# Patient Record
Sex: Male | Born: 1940 | Race: White | Hispanic: No | Smoking: Former smoker
Health system: Southern US, Community
[De-identification: ages and names within clinical notes are randomized; demographics above are authoritative.]

## PROBLEM LIST (undated history)

## (undated) DIAGNOSIS — J449 Chronic obstructive pulmonary disease, unspecified: Secondary | ICD-10-CM

## (undated) DIAGNOSIS — C439 Malignant melanoma of skin, unspecified: Secondary | ICD-10-CM

## (undated) DIAGNOSIS — J189 Pneumonia, unspecified organism: Secondary | ICD-10-CM

## (undated) DIAGNOSIS — I10 Essential (primary) hypertension: Secondary | ICD-10-CM

## (undated) HISTORY — DX: Essential (primary) hypertension: I10

## (undated) HISTORY — DX: Chronic obstructive pulmonary disease, unspecified: J44.9

## (undated) HISTORY — DX: Malignant melanoma of skin, unspecified: C43.9

## (undated) HISTORY — PX: HEMORRHOID SURGERY: SHX153

## (undated) HISTORY — DX: Pneumonia, unspecified organism: J18.9

---

## 1999-12-07 ENCOUNTER — Ambulatory Visit (HOSPITAL_COMMUNITY): Admission: RE | Admit: 1999-12-07 | Discharge: 1999-12-07 | Payer: Self-pay | Admitting: *Deleted

## 1999-12-25 ENCOUNTER — Ambulatory Visit (HOSPITAL_COMMUNITY): Admission: RE | Admit: 1999-12-25 | Discharge: 1999-12-25 | Payer: Self-pay | Admitting: *Deleted

## 2000-08-23 ENCOUNTER — Encounter: Payer: Self-pay | Admitting: Hematology and Oncology

## 2000-08-23 ENCOUNTER — Encounter: Admission: RE | Admit: 2000-08-23 | Discharge: 2000-08-23 | Payer: Self-pay | Admitting: Hematology and Oncology

## 2000-11-13 ENCOUNTER — Encounter: Payer: Self-pay | Admitting: Hematology and Oncology

## 2000-11-13 ENCOUNTER — Encounter: Admission: RE | Admit: 2000-11-13 | Discharge: 2000-11-13 | Payer: Self-pay | Admitting: Hematology and Oncology

## 2011-02-16 HISTORY — PX: OTHER SURGICAL HISTORY: SHX169

## 2011-03-13 ENCOUNTER — Ambulatory Visit (HOSPITAL_BASED_OUTPATIENT_CLINIC_OR_DEPARTMENT_OTHER)
Admission: RE | Admit: 2011-03-13 | Discharge: 2011-03-13 | Disposition: A | Discharge status: Home / Self Care | Payer: Medicare Other | Source: Ambulatory Visit | Attending: Pulmonary Disease | Admitting: Pulmonary Disease

## 2011-03-13 ENCOUNTER — Encounter: Payer: Self-pay | Admitting: Pulmonary Disease

## 2011-03-13 ENCOUNTER — Ambulatory Visit (INDEPENDENT_AMBULATORY_CARE_PROVIDER_SITE_OTHER): Payer: Medicare Other | Admitting: Pulmonary Disease

## 2011-03-13 VITALS — BP 130/60 | HR 120 | Temp 97.5°F | Ht 61.5 in | Wt 124.0 lb

## 2011-03-13 DIAGNOSIS — Z77090 Contact with and (suspected) exposure to asbestos: Secondary | ICD-10-CM

## 2011-03-13 DIAGNOSIS — J449 Chronic obstructive pulmonary disease, unspecified: Secondary | ICD-10-CM

## 2011-03-13 DIAGNOSIS — J4489 Other specified chronic obstructive pulmonary disease: Secondary | ICD-10-CM

## 2011-03-13 NOTE — Assessment & Plan Note (Signed)
Gold stg 4, FEV1 24% 6/12 Add spiriva to advair OK to use ventolin prn, If no improvement on spiriva (or too expensive) consider nebs at home. Chest xray today for h/o asbestos exposure Consider pulm rehab & O2 evaluation on future visits

## 2011-03-13 NOTE — Progress Notes (Signed)
  Subjective:    Patient ID: [name], male    DOB: [birth year], 70 y.o.   MRN: [id number]  HPI 70/M, heavy ex smoker , quit 1992 for evaluation of COPD & asbestos exposure. He report sincreasing dyspnea for the last few years & now can barely walk a few feet. He was diagnosed with COPD several years ago & is maintained on advair bid & ventolin prn - he uses this 5-6 times daily. He takes lisinopril for hypertension & verapamil. He denies frequent chest colds or recent hospitalisatins. Daughter reports some wt loss - unquantified, fatigue & sleepy all the time. Sprieomtry showed severe airway obstruction- FEv1 24%. 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 Negative for fever.  HENT: Positive for nosebleeds, congestion, rhinorrhea, sneezing and postnasal drip. Negative for ear pain, sore throat, trouble swallowing and dental problem.   Eyes: Negative for redness.  Respiratory: Positive for cough, shortness of breath and wheezing.   Cardiovascular: Negative for chest pain, palpitations and leg swelling.  Gastrointestinal: Positive for vomiting. Negative for nausea, abdominal pain and diarrhea.  Genitourinary: Negative for dysuria and urgency.  Musculoskeletal: Negative for joint swelling.  Skin: Negative for rash.  Neurological: Positive for light-headedness. Negative for syncope and headaches.  Hematological: Does not bruise/bleed easily.  Psychiatric/Behavioral: Negative for dysphoric mood. The patient is nervous/anxious.        Objective:   Physical Exam Gen. Pleasant, well-nourished, in no distress, normal affect, in wheelchair ENT - no lesions, no post nasal drip Neck: No JVD, no thyromegaly, no carotid bruits Lungs: no use of accessory muscles, no dullness to percussion, decreased  without rales or rhonchi  Cardiovascular: Rhythm regular, heart  sounds  normal, no murmurs or gallops, no peripheral edema Abdomen: soft and non-tender, no hepatosplenomegaly, BS normal. Musculoskeletal: No deformities, no cyanosis or clubbing Neuro:  alert, non focal        Assessment & Plan:

## 2011-03-13 NOTE — Patient Instructions (Addendum)
Your lung capacity is 26% Chest xray today Trial of spiriva- call us in 1 week  For Rx if this helps. If this does not help , we will consider nebuliser next visit

## 2011-03-15 ENCOUNTER — Telehealth: Payer: Self-pay | Admitting: Pulmonary Disease

## 2011-03-15 NOTE — Telephone Encounter (Signed)
I informed pt of RA's findings and recommendations. Pt verbalized understanding. See result note.

## 2011-03-15 NOTE — Progress Notes (Signed)
Quick Note:  I informed pt of RA's findings and recommendations. Pt verbalized understanding  ______

## 2011-03-21 ENCOUNTER — Telehealth: Payer: Self-pay | Admitting: Pulmonary Disease

## 2011-03-21 MED ORDER — TIOTROPIUM BROMIDE MONOHYDRATE 18 MCG IN CAPS: 18.0000 ug | ORAL_CAPSULE | Freq: Every day | RESPIRATORY_TRACT | Status: DC

## 2011-03-21 NOTE — Telephone Encounter (Signed)
Called and spoke with pt's daughter, Chip Boer.  Chip Boer states pt was recently seen by RA on 7/3 and given sample of Spiriva and told to call back for rx if it helped.  Chip Boer states pt has tried the Spiriva and his breathing has improved and therefore is requesting 90 day rx sent to pharmacy.  Rx sent to pharmacy. Pt aware.

## 2011-03-21 NOTE — Telephone Encounter (Signed)
ATC NA, no option to leave msg, [facility]

## 2011-03-21 NOTE — Telephone Encounter (Signed)
This is a duplicate message.  See other phone note dated 7/11 for complete details.

## 2011-04-02 ENCOUNTER — Inpatient Hospital Stay (HOSPITAL_COMMUNITY)
Admission: EM | Admit: 2011-04-02 | Discharge: 2011-04-17 | DRG: 329 | Disposition: A | Discharge status: Home Health | Payer: Medicare Other | Attending: Internal Medicine | Admitting: Internal Medicine

## 2011-04-02 ENCOUNTER — Emergency Department (HOSPITAL_COMMUNITY): Payer: Medicare Other

## 2011-04-02 ENCOUNTER — Inpatient Hospital Stay (HOSPITAL_COMMUNITY): Payer: Medicare Other

## 2011-04-02 DIAGNOSIS — K560 Paralytic ileus: Secondary | ICD-10-CM | POA: Diagnosis not present

## 2011-04-02 DIAGNOSIS — F19921 Other psychoactive substance use, unspecified with intoxication with delirium: Secondary | ICD-10-CM | POA: Diagnosis not present

## 2011-04-02 DIAGNOSIS — T4275XA Adverse effect of unspecified antiepileptic and sedative-hypnotic drugs, initial encounter: Secondary | ICD-10-CM | POA: Diagnosis not present

## 2011-04-02 DIAGNOSIS — R5381 Other malaise: Secondary | ICD-10-CM | POA: Diagnosis present

## 2011-04-02 DIAGNOSIS — E876 Hypokalemia: Secondary | ICD-10-CM | POA: Diagnosis not present

## 2011-04-02 DIAGNOSIS — J4489 Other specified chronic obstructive pulmonary disease: Secondary | ICD-10-CM | POA: Diagnosis present

## 2011-04-02 DIAGNOSIS — I498 Other specified cardiac arrhythmias: Secondary | ICD-10-CM | POA: Diagnosis present

## 2011-04-02 DIAGNOSIS — R339 Retention of urine, unspecified: Secondary | ICD-10-CM | POA: Diagnosis not present

## 2011-04-02 DIAGNOSIS — D630 Anemia in neoplastic disease: Secondary | ICD-10-CM | POA: Diagnosis present

## 2011-04-02 DIAGNOSIS — K2970 Gastritis, unspecified, without bleeding: Secondary | ICD-10-CM | POA: Diagnosis present

## 2011-04-02 DIAGNOSIS — K5730 Diverticulosis of large intestine without perforation or abscess without bleeding: Secondary | ICD-10-CM | POA: Diagnosis present

## 2011-04-02 DIAGNOSIS — J449 Chronic obstructive pulmonary disease, unspecified: Secondary | ICD-10-CM | POA: Diagnosis present

## 2011-04-02 DIAGNOSIS — F17200 Nicotine dependence, unspecified, uncomplicated: Secondary | ICD-10-CM | POA: Diagnosis present

## 2011-04-02 DIAGNOSIS — Z79899 Other long term (current) drug therapy: Secondary | ICD-10-CM

## 2011-04-02 DIAGNOSIS — Z85820 Personal history of malignant melanoma of skin: Secondary | ICD-10-CM

## 2011-04-02 DIAGNOSIS — E236 Other disorders of pituitary gland: Secondary | ICD-10-CM | POA: Diagnosis present

## 2011-04-02 DIAGNOSIS — C180 Malignant neoplasm of cecum: Principal | ICD-10-CM | POA: Diagnosis present

## 2011-04-02 DIAGNOSIS — J9620 Acute and chronic respiratory failure, unspecified whether with hypoxia or hypercapnia: Secondary | ICD-10-CM | POA: Diagnosis not present

## 2011-04-02 DIAGNOSIS — K2990 Gastroduodenitis, unspecified, without bleeding: Secondary | ICD-10-CM | POA: Diagnosis present

## 2011-04-02 DIAGNOSIS — I10 Essential (primary) hypertension: Secondary | ICD-10-CM | POA: Diagnosis present

## 2011-04-02 LAB — COMPREHENSIVE METABOLIC PANEL
AST: 17 U/L (ref 0–37)
CO2: 24 mEq/L (ref 19–32)
Chloride: 89 mEq/L — ABNORMAL LOW (ref 96–112)
Creatinine, Ser: 0.73 mg/dL (ref 0.50–1.35)
GFR calc Af Amer: >60 mL/min (ref >60)
GFR calc non Af Amer: >60 mL/min (ref >60)
Glucose, Bld: 120 mg/dL — ABNORMAL HIGH (ref 70–99)
Total Bilirubin: 0.3 mg/dL (ref 0.3–1.2)

## 2011-04-02 LAB — PREPARE RBC (CROSSMATCH)

## 2011-04-02 LAB — PROTIME-INR: INR: 1.04 (ref 0.00–1.49)

## 2011-04-02 LAB — CBC
MCH: 16.9 pg — ABNORMAL LOW (ref 26.0–34.0)
MCHC: 28.3 g/dL — ABNORMAL LOW (ref 30.0–36.0)
Platelets: 568 10*3/uL — ABNORMAL HIGH (ref 150–400)

## 2011-04-02 LAB — URINALYSIS, ROUTINE W REFLEX MICROSCOPIC
Bilirubin Urine: NEGATIVE
Hgb urine dipstick: NEGATIVE
Specific Gravity, Urine: 1.017 (ref 1.005–1.030)
Urobilinogen, UA: 0.2 mg/dL (ref 0.0–1.0)
pH: 6.5 (ref 5.0–8.0)

## 2011-04-02 LAB — IRON AND TIBC
Iron: 10 ug/dL — ABNORMAL LOW (ref 42–135)
Saturation Ratios: 2 % — ABNORMAL LOW (ref 20–55)
UIBC: 410 ug/dL

## 2011-04-02 LAB — DIFFERENTIAL
Basophils Absolute: 0 10*3/uL (ref 0.0–0.1)
Eosinophils Absolute: 0 10*3/uL (ref 0.0–0.7)
Lymphocytes Relative: 8 % — ABNORMAL LOW (ref 12–46)
Neutro Abs: 10 10*3/uL — ABNORMAL HIGH (ref 1.7–7.7)
Neutrophils Relative %: 85 % — ABNORMAL HIGH (ref 43–77)

## 2011-04-02 LAB — OCCULT BLOOD, POC DEVICE: Fecal Occult Bld: NEGATIVE

## 2011-04-03 ENCOUNTER — Inpatient Hospital Stay (HOSPITAL_COMMUNITY): Payer: Medicare Other

## 2011-04-03 DIAGNOSIS — M7989 Other specified soft tissue disorders: Secondary | ICD-10-CM

## 2011-04-03 LAB — DIFFERENTIAL
Band Neutrophils: 0 % (ref 0–10)
Basophils Absolute: 0 10*3/uL (ref 0.0–0.1)
Basophils Relative: 0 % (ref 0–1)
Blasts: 0 %
Eosinophils Absolute: 0 10*3/uL (ref 0.0–0.7)
Eosinophils Relative: 0 % (ref 0–5)
Lymphocytes Relative: 7 % — ABNORMAL LOW (ref 12–46)
Lymphs Abs: 0.7 10*3/uL (ref 0.7–4.0)
Metamyelocytes Relative: 0 %
Monocytes Absolute: 0.3 10*3/uL (ref 0.1–1.0)
Monocytes Relative: 3 % (ref 3–12)
Myelocytes: 0 %
Neutro Abs: 9 10*3/uL — ABNORMAL HIGH (ref 1.7–7.7)
Neutrophils Relative %: 90 % — ABNORMAL HIGH (ref 43–77)
Promyelocytes Absolute: 0 %
nRBC: 0 /100 WBC

## 2011-04-03 LAB — COMPREHENSIVE METABOLIC PANEL
ALT: 9 U/L (ref 0–53)
AST: 17 U/L (ref 0–37)
Albumin: 3.1 g/dL — ABNORMAL LOW (ref 3.5–5.2)
Alkaline Phosphatase: 51 U/L (ref 39–117)
BUN: 4 mg/dL — ABNORMAL LOW (ref 6–23)
CO2: 26 mEq/L (ref 19–32)
Calcium: 8.6 mg/dL (ref 8.4–10.5)
Chloride: 91 mEq/L — ABNORMAL LOW (ref 96–112)
Creatinine, Ser: 0.55 mg/dL (ref 0.50–1.35)
GFR calc Af Amer: >60 mL/min (ref >60)
GFR calc non Af Amer: >60 mL/min (ref >60)
Glucose, Bld: 109 mg/dL — ABNORMAL HIGH (ref 70–99)
Potassium: 3.7 mEq/L (ref 3.5–5.1)
Sodium: 123 mEq/L — ABNORMAL LOW (ref 135–145)
Total Bilirubin: 0.8 mg/dL (ref 0.3–1.2)
Total Protein: 6 g/dL (ref 6.0–8.3)

## 2011-04-03 LAB — CBC
HCT: 24.1 % — ABNORMAL LOW (ref 39.0–52.0)
Hemoglobin: 7.4 g/dL — ABNORMAL LOW (ref 13.0–17.0)
MCH: 20.3 pg — ABNORMAL LOW (ref 26.0–34.0)
MCHC: 30.7 g/dL (ref 30.0–36.0)
MCV: 66 fL — ABNORMAL LOW (ref 78.0–100.0)
Platelets: 394 10*3/uL (ref 150–400)
RBC: 3.65 MIL/uL — ABNORMAL LOW (ref 4.22–5.81)
RDW: 25.1 % — ABNORMAL HIGH (ref 11.5–15.5)
WBC: 10 10*3/uL (ref 4.0–10.5)

## 2011-04-03 LAB — MAGNESIUM: Magnesium: 1.3 mg/dL — ABNORMAL LOW (ref 1.5–2.5)

## 2011-04-03 LAB — PHOSPHORUS: Phosphorus: 3.5 mg/dL (ref 2.3–4.6)

## 2011-04-03 LAB — PREPARE RBC (CROSSMATCH)

## 2011-04-03 MED ORDER — IOHEXOL 300 MG/ML  SOLN
100.0000 mL | Freq: Once | INTRAMUSCULAR | Status: AC | PRN
  Administered 2011-04-03: 100 mL via INTRAVENOUS

## 2011-04-03 NOTE — H&P (Signed)
[name], [name]                 ACCOUNT NO.:  [id number]  MEDICAL RECORD NO.:  [id number]  LOCATION:  WLED                         FACILITY:  [facility]  PHYSICIAN:  Michiel Cowboy, MDDATE OF BIRTH:  August 29, 1941  DATE OF ADMISSION:  04/02/2011 DATE OF DISCHARGE:                             HISTORY & PHYSICAL   PRIMARY CARE PHYSICIAN:  Candie Echevaria, DO, [facility].  CHIEF COMPLAINT:  He was sent here by his primary care provider for hemoglobin.  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  The patient was told for the past 2 years that he has been anemic.  He never required blood transfusions in the past.  He was told that he does have some blood in his stools, although he does not have it today.  He never noticed any melena or bright red blood per rectum.  He does note about 20-pound weight loss over the past 6 months.  He never had a colonoscopy done.  He does have some shortness of breath which is his baseline because of COPD.  He does not use any oxygen.  He also noticed that his left leg has been swollen for the past maybe 1 month, may be less.  REVIEW OF SYSTEMS:  Otherwise review of systems is negative, 10 systems reviewed.  No fevers no chills.  No worsening cough.  No ataxia.  No neurological complaints.  No nausea or vomiting.  No constipation or diarrhea.  No chest pain also.  PAST MEDICAL HISTORY:  Significant for: 1. COPD, followed by Dr. Vassie Loll. 2. Anemia, chronic, unclear etiology. 3. History of Hemoccult positive stools, not fully worked up. 4. Hypertension. 5. History of melanoma, removed in 2001.  SOCIAL HISTORY:  The patient is a former smoker, quit 20 years ago but since then has been chewing tobacco.  He drinks on occasion, does not abuse drugs.  FAMILY HISTORY:  The patient is  adopted.  ALLERGIES:  He is not quite sure but one of the BLOOD PRESSURE MEDICINES he once received seemed to be too strong.  MEDICATIONS: 1. Spiriva inhaled once a day. 2. Multivitamins. 3. Advair 250/50, 1 puff in the evening. 4. Albuterol inhaler as needed. 5. Verapamil 120 mg daily. 6. Lisinopril 20 mg daily. 7. Hydrochlorothiazide 25 mg daily.  PHYSICAL EXAMINATION:  VITAL SIGNS:  Temperature 97.8, blood pressure 173/86, pulse initially was 142 now down to 113.  Of note, per his records from his pulmonologist's office, his heart rate was in 120s. Respirations 24, saturating 94% on room air and 100% on 2 L. GENERAL:  The patient appears to be in no acute distress, thin male. HEENT AND NECK:  Head nontraumatic.  There are heavy crevices on angles of the mouth.  No lymphadenopathy could be appreciated.  Moist mucous membranes. LUNGS:  Very distant breath sounds but no wheezes. HEART:    No murmurs appreciated, but also very distant. ABDOMEN:  Soft, nontender, nondistended. EXTREMITIES:  Trace edema on the left lower extremity, which is not same as in the right.  NEUROLOGIC:  Grossly intact. SKIN:  Slight sun exposure but otherwise normal skin turgor.  I could appreciate no significant lesions.  PERTINENT LABORATORY AND X-RAY DATA:  White blood cell count 11.7, hemoglobin 5.8.  Sodium 124, potassium 3.8, creatinine 0.73, blood sugar 120.  Total protein 7.0, albumin 3.7.  LFTs within normal limits. Hemoccult negative.  Chest x-ray shows interval increase in right-sided middle lobe disease, scarring versus atelectasis versus infiltrate.  Of note, the MCV count is 59.6.  EKG was obtained and shows sinus tachycardia, heart rate of 118.  No significant ST depressions or elevations.  No ischemia sign.  ASSESSMENT AND PLAN:  This is a 70 year old gentleman with anemia of unclear etiology.  In the past, he has had Hemoccult positive stools, although they are negative currently.   Per emergency physician, they have already obtained anemia panel.  Will admit for further evaluation. Will transfuse 2 units of packed red blood cells as the patient is symptomatic; he is tachycardic and somewhat short of breath.  Will continue to do Hemoccult stools; it could be a very slow bleed.  He never had a colonoscopy.  Given his weight loss, I think he would greatly benefit from one; this could be done as an outpatient if no significant bleeding occurs.  He probably would benefit from cancer workup altogether given his weight loss and anemia.  Hyponatremia:  Will obtain urine sodium, creatinine and urine osmolarity.  Check orthostatics.  Stop hydrochlorothiazide.  Check TSH.  Abnormal chest x-ray findings:  This has been persistent since the beginning of July.  He has not had any symptoms which would be consistent with pneumonia.  At this point, we will obtain CT scan of the chest to evaluate this further.  Given his smoking history and significant COPD, weight loss, I would evaluate for a mass.  Hypertension:  For right now, continue lisinopril and verapamil.  Weight loss:  Will have to have oncological workup done.  This could be completed as an outpatient as well.  Chronic obstructive pulmonary disease:  At this point appears to be stable.  We will continue his home medications.  He is significantly anemic, we can keep him on oxygen, but now titrate to keep O2 saturations between 90% to 94%.  Prophylaxis:  Protonix, and __________.  Left leg swelling:  We will obtain Dopplers.  CODE STATUS:  At this point, the patient wishes to be full code, which was confirmed by his daughter who is at bedside.     Michiel Cowboy, MD     AVD/MEDQ  D:  04/02/2011  T:  04/02/2011  Job:  [id number]  cc:   Candie Echevaria, DO Fax: [phone]  Electronically Signed by Therisa Doyne MD on 04/03/2011 10:18:52 PM

## 2011-04-04 LAB — TYPE AND SCREEN
ABO/RH(D): O POS
Antibody Screen: NEGATIVE
Unit division: 0

## 2011-04-04 LAB — BASIC METABOLIC PANEL
BUN: 3 mg/dL — ABNORMAL LOW (ref 6–23)
CO2: 23 mEq/L (ref 19–32)
Calcium: 8.4 mg/dL (ref 8.4–10.5)
Creatinine, Ser: 0.49 mg/dL — ABNORMAL LOW (ref 0.50–1.35)
Glucose, Bld: 84 mg/dL (ref 70–99)

## 2011-04-04 LAB — CBC
HCT: 28.4 % — ABNORMAL LOW (ref 39.0–52.0)
MCH: 22.3 pg — ABNORMAL LOW (ref 26.0–34.0)
MCV: 69.6 fL — ABNORMAL LOW (ref 78.0–100.0)
Platelets: 325 10*3/uL (ref 150–400)
RBC: 4.08 MIL/uL — ABNORMAL LOW (ref 4.22–5.81)
WBC: 6.5 10*3/uL (ref 4.0–10.5)

## 2011-04-04 LAB — SODIUM, URINE, RANDOM: Sodium, Ur: 33 mEq/L

## 2011-04-04 LAB — OSMOLALITY, URINE: Osmolality, Ur: 148 mOsm/kg — ABNORMAL LOW (ref 390–1090)

## 2011-04-04 LAB — CORTISOL: Cortisol, Plasma: 16.6 ug/dL

## 2011-04-04 NOTE — Consult Note (Signed)
  NAMERAHSAAN, WEAKLAND NO.:  [id number]  MEDICAL RECORD NO.:  [id number]  LOCATION:  1507                         FACILITY:  [facility]  PHYSICIAN:  Graylin Shiver, M.D.   DATE OF BIRTH:  November 02, 1940  DATE OF CONSULTATION:  04/04/2011 DATE OF DISCHARGE:                                CONSULTATION   REASON FOR CONSULTATION:  The patient is a 70 year old male with a history of COPD and a prior history of melanoma on his back that was removed in 2001.  He was told that he was anemic a couple of years ago and also was found to have heme-positive stool a couple of years ago. He was told that he should have colonoscopy but never did.  He has lost about 20 pounds over the past 6 months.  He has not seen any blood in his stool and denies melena.  He was found on admission to have severe anemia with a hemoglobin of 5.8 and hematocrit of 20.5.  Iron studies were compatible with iron deficiency.  B12 level and folate levels were normal.  His stools for occult blood during this admission have been negative.  We are asked to see him in regards to iron deficiency anemia and setting him up for colonoscopy to evaluate.  PAST HISTORY:  COPD, hypertension, history of melanoma.  SOCIAL HISTORY:  States he used to drink and used to smoke.  ALLERGIES:  Did not know names of any medicines he was allergic to.  MEDICATIONS PRIOR TO ADMISSION:  Spiriva, multivitamin, Advair, albuterol, verapamil, lisinopril, hydrochlorothiazide.  REVIEW OF SYSTEMS:  No complaints of chest pain but he does have problems with shortness of breath due to his COPD.  PHYSICAL EXAMINATION:  GENERAL:  He does not appear in any acute distress. VITAL SIGNS:  Stable. HEART:  Regular rhythm.  No murmurs heard. LUNGS:  Decreased breath sounds bilaterally.  He does seem to have a barrel chest. ABDOMEN:  Bowel sounds are normal.  Soft, nontender.  No hepatosplenomegaly.  IMPRESSION:  Iron-deficiency anemia,  etiology unclear.  COMMENTS:  In view of the past history of heme-positive stool and iron- deficiency anemia and also weight loss, I would certainly recommend a colonoscopy.  I discussed this with the patient but he wants to think about it and discuss this first with his daughter and his brother before agreeing to have it done.  He states that his brother had a colonoscopy in the past and told him that he would never have another one.  It appears that the patient is somewhat reluctant at this time to have a colonoscopy but we will think about it. I will get back in touch with him to see what he has decided.  He is aware that he could have an underlying malignancy in the colon or polyp in the colon but he still wants to think about it.          ______________________________ Graylin Shiver, M.D.     SFG/MEDQ  D:  04/04/2011  T:  04/04/2011  Job:  [id number]  Electronically Signed by Herbert Moors MD on 04/04/2011 03:44:50 PM

## 2011-04-05 ENCOUNTER — Other Ambulatory Visit: Payer: Self-pay | Admitting: Gastroenterology

## 2011-04-05 LAB — CBC
HCT: 30.9 % — ABNORMAL LOW (ref 39.0–52.0)
Hemoglobin: 9.7 g/dL — ABNORMAL LOW (ref 13.0–17.0)
MCH: 22.2 pg — ABNORMAL LOW (ref 26.0–34.0)
MCHC: 31.4 g/dL (ref 30.0–36.0)
MCV: 70.7 fL — ABNORMAL LOW (ref 78.0–100.0)
RBC: 4.37 MIL/uL (ref 4.22–5.81)

## 2011-04-05 LAB — BASIC METABOLIC PANEL
BUN: <3 mg/dL — ABNORMAL LOW (ref 6–23)
CO2: 26 mEq/L (ref 19–32)
Calcium: 9 mg/dL (ref 8.4–10.5)
Creatinine, Ser: 0.49 mg/dL — ABNORMAL LOW (ref 0.50–1.35)
GFR calc non Af Amer: >60 mL/min (ref >60)
Glucose, Bld: 80 mg/dL (ref 70–99)

## 2011-04-06 ENCOUNTER — Inpatient Hospital Stay (HOSPITAL_COMMUNITY): Payer: Medicare Other

## 2011-04-06 DIAGNOSIS — J449 Chronic obstructive pulmonary disease, unspecified: Secondary | ICD-10-CM

## 2011-04-06 DIAGNOSIS — D490 Neoplasm of unspecified behavior of digestive system: Secondary | ICD-10-CM

## 2011-04-06 LAB — CBC
MCH: 22.3 pg — ABNORMAL LOW (ref 26.0–34.0)
MCHC: 31.5 g/dL (ref 30.0–36.0)
MCV: 70.8 fL — ABNORMAL LOW (ref 78.0–100.0)
Platelets: 334 10*3/uL (ref 150–400)

## 2011-04-06 LAB — BASIC METABOLIC PANEL
CO2: 25 mEq/L (ref 19–32)
Calcium: 8.6 mg/dL (ref 8.4–10.5)
Creatinine, Ser: 0.53 mg/dL (ref 0.50–1.35)
GFR calc non Af Amer: >60 mL/min (ref >60)
Sodium: 128 mEq/L — ABNORMAL LOW (ref 135–145)

## 2011-04-06 LAB — CEA: CEA: 1.2 ng/mL (ref 0.0–5.0)

## 2011-04-06 MED ORDER — IOHEXOL 300 MG/ML  SOLN
100.0000 mL | Freq: Once | INTRAMUSCULAR | Status: AC | PRN
  Administered 2011-04-06: 100 mL via INTRAVENOUS

## 2011-04-07 LAB — SURGICAL PCR SCREEN: MRSA, PCR: NEGATIVE

## 2011-04-08 ENCOUNTER — Inpatient Hospital Stay (HOSPITAL_COMMUNITY): Payer: Medicare Other

## 2011-04-08 LAB — BASIC METABOLIC PANEL
CO2: 27 mEq/L (ref 19–32)
Chloride: 97 mEq/L (ref 96–112)
Glucose, Bld: 96 mg/dL (ref 70–99)
Potassium: 3.5 mEq/L (ref 3.5–5.1)
Sodium: 130 mEq/L — ABNORMAL LOW (ref 135–145)

## 2011-04-08 LAB — CBC
Hemoglobin: 9 g/dL — ABNORMAL LOW (ref 13.0–17.0)
Platelets: 301 10*3/uL (ref 150–400)
RBC: 4 MIL/uL — ABNORMAL LOW (ref 4.22–5.81)
WBC: 5.9 10*3/uL (ref 4.0–10.5)

## 2011-04-09 ENCOUNTER — Other Ambulatory Visit (INDEPENDENT_AMBULATORY_CARE_PROVIDER_SITE_OTHER): Payer: Self-pay | Admitting: General Surgery

## 2011-04-09 DIAGNOSIS — C189 Malignant neoplasm of colon, unspecified: Secondary | ICD-10-CM

## 2011-04-09 LAB — BASIC METABOLIC PANEL
BUN: <3 mg/dL — ABNORMAL LOW (ref 6–23)
CO2: 29 mEq/L (ref 19–32)
Chloride: 93 mEq/L — ABNORMAL LOW (ref 96–112)
GFR calc non Af Amer: >60 mL/min (ref >60)
Glucose, Bld: 100 mg/dL — ABNORMAL HIGH (ref 70–99)
Potassium: 3.9 mEq/L (ref 3.5–5.1)
Sodium: 127 mEq/L — ABNORMAL LOW (ref 135–145)

## 2011-04-10 ENCOUNTER — Inpatient Hospital Stay (HOSPITAL_COMMUNITY): Payer: Medicare Other

## 2011-04-10 DIAGNOSIS — J9620 Acute and chronic respiratory failure, unspecified whether with hypoxia or hypercapnia: Secondary | ICD-10-CM

## 2011-04-10 DIAGNOSIS — J159 Unspecified bacterial pneumonia: Secondary | ICD-10-CM

## 2011-04-10 DIAGNOSIS — C189 Malignant neoplasm of colon, unspecified: Secondary | ICD-10-CM

## 2011-04-10 DIAGNOSIS — J438 Other emphysema: Secondary | ICD-10-CM

## 2011-04-10 LAB — CBC
HCT: 34.2 % — ABNORMAL LOW (ref 39.0–52.0)
MCHC: 31 g/dL (ref 30.0–36.0)
Platelets: 281 10*3/uL (ref 150–400)
RDW: UNDETERMINED % (ref 11.5–15.5)

## 2011-04-10 LAB — BLOOD GAS, ARTERIAL
Bicarbonate: 28.8 mEq/L — ABNORMAL HIGH (ref 20.0–24.0)
Delivery systems: POSITIVE
Expiratory PAP: 5
FIO2: 0.4 %
O2 Saturation: 93.6 %
Patient temperature: 99
Patient temperature: 98.6
TCO2: 26.5 mmol/L (ref 0–100)
TCO2: 27.6 mmol/L (ref 0–100)
pCO2 arterial: 50.1 mmHg — ABNORMAL HIGH (ref 35.0–45.0)
pH, Arterial: 7.397 (ref 7.350–7.450)

## 2011-04-10 LAB — CROSSMATCH
ABO/RH(D): O POS
Antibody Screen: NEGATIVE
Unit division: 0

## 2011-04-10 LAB — COMPREHENSIVE METABOLIC PANEL
Albumin: 2.6 g/dL — ABNORMAL LOW (ref 3.5–5.2)
Alkaline Phosphatase: 53 U/L (ref 39–117)
BUN: 4 mg/dL — ABNORMAL LOW (ref 6–23)
CO2: 29 mEq/L (ref 19–32)
Chloride: 92 mEq/L — ABNORMAL LOW (ref 96–112)
GFR calc non Af Amer: >60 mL/min (ref >60)
Glucose, Bld: 105 mg/dL — ABNORMAL HIGH (ref 70–99)
Potassium: 4.1 mEq/L (ref 3.5–5.1)
Total Bilirubin: 0.7 mg/dL (ref 0.3–1.2)

## 2011-04-10 LAB — BASIC METABOLIC PANEL
BUN: 4 mg/dL — ABNORMAL LOW (ref 6–23)
CO2: 31 mEq/L (ref 19–32)
Calcium: 8.7 mg/dL (ref 8.4–10.5)
GFR calc non Af Amer: >60 mL/min (ref >60)
Glucose, Bld: 84 mg/dL (ref 70–99)
Potassium: 4.2 mEq/L (ref 3.5–5.1)

## 2011-04-10 LAB — MAGNESIUM: Magnesium: 1.1 mg/dL — ABNORMAL LOW (ref 1.5–2.5)

## 2011-04-10 MED ORDER — LORAZEPAM 2 MG/ML IJ SOLN
INTRAMUSCULAR | Status: AC
  Filled 2011-04-10: qty 1

## 2011-04-10 NOTE — Op Note (Signed)
NAMEABIE, CHEEK                 ACCOUNT NO.:  [id number]  MEDICAL RECORD NO.:  [id number]  LOCATION:  1507                         FACILITY:  [facility]  PHYSICIAN:  Anselm Pancoast. Ezrah Panning, M.D.DATE OF BIRTH:  05/15/1941  DATE OF PROCEDURE:  04/09/2011 DATE OF DISCHARGE:                              OPERATIVE REPORT   PREOPERATIVE DIAGNOSES: 1. Carcinoma of the cecum, ascending colon. 2. History of severe chronic obstructive pulmonary disease secondary     to smoking.  OPERATION:  Right colectomy through a limited right transverse incision with ileotransverse colon anastomosis.  SURGEON:  Anselm Pancoast. Zachery Dakins, MD  ASSISTANT:  Eber Hong, PA  ANESTHESIA:  General anesthesia.  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  He was admitted extremely anemic, transfused, had a CT that showed a apple core lesion in the cecum and ascending colon area and he had a colonoscopy by Dr. Evette Cristal that is biopsy-proven adenocarcinoma.  The patient was seen by the pulmonary people and also the general surgeons and Dr. Delford Field and associates had recommended that we give him several days of hospitalization for management of his COPD and surgery on Monday or possibly Tuesday of this week.  The patient has been transfused 4 units of blood.  His hematocrit is approximately 29 and we continued on his clear liquid diet after the colonoscopy with no additional bowel prep.  I saw him this morning and he desired to go ahead and proceed with surgery and Dr. Delford Field yesterday afternoon said it was okay to proceed even today or tomorrow and he was added to the OR schedule.  I gave him 1 g of Mefoxin preoperatively.  He has got PAS stockings and taken to the operative suite.  We permitted him for right colectomy.  I  reviewed his CT and colonoscopy reports with him and he understands and we are planning to do a limited right colectomy.  There was no evidence of any metastatic disease in the liver.  You could see a little what is probably cholesterolosis in the gallbladder, but no evidence of any stones.  I hope that we can do it through a small right transverse incision, he is fairly thin and patient was in agreement.  We will send him to step-down postoperatively because of his breathing issues.  DESCRIPTION OF PROCEDURE:  The patient was taken to the operative suite, given 1 g of Mefoxin, taken in 1 bag.  The anesthesiologist assisted in placement of the endotracheal tube and then the abdomen, after a Foley catheter had been inserted sterilely, was prepped with Betadine solution and draped in a sterile manner.  I elected to make a little transverse incision to the right of the umbilicus and then sharp dissection down through the skin and subcutaneous tissue.  The anterior fascia was opened and then the underlying rectus muscle was elevated over a Kelly and then divided with cautery.  We picked up the posterior peritoneum and rectus fascia, then the slow anesthetic muscle  relaxant was coming forth and there was a little bit of straining.  The anesthesiologist then basically paralyzed him.  The incision was opened a little further, probably about a 2.5 inch all total, so I could get a medium-sized Senaida Ores and you could see the tumor and feel the tumor right under the lateral below the incision.  The appendix was normal and there was a little bit of fluid around it, but it appears to be clear, nothing that I could see any peritoneal implants  or anything.  The lateral peritoneum was opened up carefully with cautery and Metzenbaum, so I could mobilize the cecum.  The appendix was pulled up into the incision and the appendiceal mesentery was clamped with a Kelly and the pedicle tied with 2-0 silk  sutures.  Now, the lateral peritoneal flexion was freed up and then the hepatic flexure area, everything was divided between [facility] and these were tied with either 2-0 or 3-0 silk and then you could mobilize the hepatic flexure nicely.  I could feel the gallbladder and I did, and did not feel any stones and there was no evidence of any inflammation of the gallbladder.  We then continued to kind of free up the posterior portion of the hepatic flexure, did not mobilize the duodenum, but could visualize it and then with the right colon up at skin level, I then picked an area about 6 inches from the ileocecal valve to start dividing the mesentery between [facility] and these were all pedicles tied with 2-0 silk.  The area of the transverse colon just to the right of the midline was also picked for the anastomosis and then the mesentery between this was divided between [facility] and doubly ligated the superior.  The right colic artery, doubly tied this with 2-0 silk, singly distal to this.  The venous and lymphatic was also tied with 2-0 silk.  With the mesentery completely divided, I then brought the terminal ileum and the transverse colon and kind of freed up the tenia and then opened the little area.  I put towels medial and lateral and then the little openings I used a GIA 55 on the antimesenteric of the ileum and the anterior tenia of the transverse colon.  This was fired and then I grabbed the suture line on both sides with an Allis and then used a TA-60 with the narrow staples and triangulated the little area after clamping and firing it more than 15 seconds or so, then divided the colon and terminal ileum and sent the specimen on the back table.  I did put a little 3-0 silk sutures on the little part inferiorly not truly mesentery, but no active bleeding and when I opened up the cecum, you could see where there was a lot of old black, I think, old blood in the cecum, so I informed his  children that he will have another bloody bowel movement or 2 postoperatively.  The mesenteric defect was divided and was closed with interrupted sutures of 3-0 silk and the anastomosis is lying without tension and you could manipulate this and has got a good 2-finger lumen and there is no evidence of any tension.  The little omentum, he does not have a generous omentum, was placed over the anastomosis and then the abdominal wall was closed using the running 0 PDS on the posterior rectus fascia and peritoneum.  A looped 0 PDS on the anterior fascia and did put about 20 cc of 0.5% Marcaine with  adrenalin in the abdominal wall laterally for kind of a limited field block.  The patient will try to get by without using the NG tube.  The anterior fascia which was coarse thicker was closed with running looped 0 PDS and then the skin was closed with staples.  The patient tolerated procedure nicely.  The estimated blood loss was minimal and grossly there was no any obviously enlarged lymph nodes and when I palpated the liver and inspected it, I find no evidence of anything that looks like metastatic disease and of course the CT had shown no evidence of any enlarged lymph nodes or any metastasis in the liver either.  The patient was sent to recovery room and we tried to use low-dose PCA morphine down, he had been given Entereg preoperatively.     Anselm Pancoast. Zachery Dakins, M.D.     WJW/MEDQ  D:  04/09/2011  T:  04/09/2011  Job:  [id number]  cc:   Graylin Shiver, M.D. Fax: [phone]  Dr. Delford Field  Hospitalist Service  Electronically Signed by Consuello Bossier M.D. on 04/10/2011 02:53:32 PM

## 2011-04-10 NOTE — Consult Note (Signed)
[name], [name]                 ACCOUNT NO.:  [id number]  MEDICAL RECORD NO.:  [id number]  LOCATION:  1507                         FACILITY:  [facility]  PHYSICIAN:  Ollen Gross. Vernell Morgans, M.D. DATE OF BIRTH:  June 05, 1941  DATE OF CONSULTATION:  04/06/2011 DATE OF DISCHARGE:                                CONSULTATION   TIME:  09:06 a.m.  REQUESTING PHYSICIAN:  Graylin Shiver, M.D.  CONSULTING SURGEON:  Ollen Gross. Vernell Morgans, M.D.  PRIMARY CARE PHYSICIAN:  Candie Echevaria, DO at [facility].  PULMONOLOGIST:  Oretha Milch, MD.  REASON FOR CONSULTATION:  Cecal mass.  HISTORY OF PRESENT ILLNESS:  [name] is a 70 year old white male with a history of COPD, hypertension and melanoma who noticed some blood in his stool over a year ago.  At that time, he had Hemoccult cards done which were positive.  Apparently, he was encouraged to get a colonoscopy but he did not follow through with this.  Over the last year or so, the patient has no longer noticed any melena or bright red blood in his stools.  However, approximately 4 days ago, the patient presented to the emergency department due to complaints of weakness and shortness of breath.  At that time, he was found to have a hemoglobin of 5.8.  A CT scan of the chest was obtained which revealed no pulmonary embolus but did show some pneumonia.  At this time, he was admitted and he was transfused and treated for his pneumonia.  He did have Hemoccult test that were done this admission which were negative but secondary to his severe anemia and a history of heme-positive stools, Gastroenterology was consulted.  The patient was taken to the endoscopy suite yesterday where he had a colonoscopy at which time he was found to have a 2.5 to 3 cm cecal mass.  Because of this finding, we have been asked to evaluate the patient for possible surgical intervention.  REVIEW OF SYSTEMS:  Please see HPI.  Otherwise, the patient admits to significant dyspnea on  exertion.  He denies any chest pain or urinary issues.  Otherwise, all other systems have been reviewed and are negative.  FAMILY HISTORY:  He does not know as he is adopted.  PAST MEDICAL HISTORY: 1. COPD. 2. Hypertension. 3. History of melanoma. 4. History of heme-positive stools with chronic anemia.  PAST SURGICAL HISTORY: 1. Removal of melanoma from right shoulder. 2. Hemorrhoidectomy.  SOCIAL HISTORY:  The patient is retired.  He lives by himself.  He quit smoking approximately 20 years ago but prior to that he smoked for 35 years approximately 2 packs a day.  He very rarely drinks any alcohol. Denies any illicit drug abuse.  ALLERGIES:  A blood pressure pill that he does not know the name of.  MEDICATIONS:  At home include: 1. Extra Strength Tylenol as needed. 2. Multivitamin. 3. Verapamil 120 mg every evening. 4. Prinivil 20 mg daily. 5. Hydrochlorothiazide 25 mg daily. 6. Spiriva 18 mcg every morning. 7. Advair Diskus 250/50 one puff every evening. 8. Albuterol inhaler 2 puffs every 6 hours p.r.n. shortness of breath.PHYSICAL EXAMINATION:  GENERAL:  [name] is  a pleasant 70 year old white male who is currently lying in bed, in no acute distress but does appear to be barrel-chested and short of breath. VITAL SIGNS:  Temperature 98.1, blood pressure 112/71, respirations 18, pulse 73. HEENT:  Head is normocephalic, atraumatic.  Sclerae noninjected.  Pupils are equal, round and reactive to light.  Ears and nose without any obvious masses or lesions.  However, he does have 2 L of nasal cannula currently.  Otherwise mouth is pink.  Throat shows no exudate. HEART:  Regular rate and rhythm.  Normal S1, S2.  No murmurs, gallops or rubs are noted. LUNGS:  Clear to auscultation bilaterally with no wheezes, rhonchi or rales noted.  However, the patient does have decreased breath sounds fairly significantly throughout.  Respiratory effort is nonlabored with oxygen  present. ABDOMEN:  Soft, nontender, nondistended with active bowel sounds.  No masses, hernias or organomegaly are noted.  MUSCULOSKELETAL:  All 4 extremities are symmetrical with no cyanosis or edema.  He does have bilateral upper extremity clubbing. SKIN:  Warm and dry with no masses, lesions or rashes. PSYCH:  The patient is alert and oriented x3 with an appropriate affect.  LABS AND DIAGNOSTICS:  Sodium 128, potassium 3.1, glucose 83, BUN less than 3, creatinine 0.53.  White blood cell count 6600, hemoglobin 8.7, hematocrit 27.6, platelet count is 334,000.  DIAGNOSTICS:  CT scan angiography of the chest reveals no pulmonary embolus or thoracic aortic dissection.  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  He did also have a colonoscopy which revealed a 2.5 to 3 cm cecal mass.  IMPRESSION: 1. Cecal mass, likely malignant, biopsies are pending. 2. Chronic anemia likely secondary to #1. 3. Chronic obstructive pulmonary disease. 4. Hypertension.  PLAN:  We will obtain a staging CT scan of the abdomen and pelvis to evaluate for metastatic disease.  I think given the size of the tumor it is very unlikely that there is any metastatic disease.  We will also obtain a CEA level in the meantime.  Given the patient's poor pulmonary status, we will have the pulmonologist see him and clear him preoperatively.  Given his decreased lung function, it is likely the patient may be on a ventilator postoperatively.  We have discussed this with the patient and daughter and they understand that this is a possibility.  We will plan for surgical intervention either sometime this weekend if we are able or early next week.  In the meantime, we will maintain him on a clear liquid diet, we would recommend do not advance him past this so that we do not have to repress him.  Currently his electrolytes are little low with a low  sodium and low potassium.  We would recommend repleting these prior to surgical intervention.  We will also obtain a type and cross in case it is needed during surgical intervention to transfuse him as he is anemic.  Otherwise, we will follow along with you.     Letha Cape, PA   ______________________________ Ollen Gross. Vernell Morgans, M.D.    KEO/MEDQ  D:  04/06/2011  T:  04/06/2011  Job:  [id number]  cc:   Oretha Milch, MD [address] [place] [address]  Candie Echevaria, DO Fax: [phone]  Graylin Shiver, M.D. Fax: [phone]  Electronically Signed by Barnetta Chapel PA on 04/09/2011 11:06:05 AM Electronically Signed by Chevis Pretty III M.D. on 04/10/2011 03:52:23 AM

## 2011-04-11 ENCOUNTER — Inpatient Hospital Stay (HOSPITAL_COMMUNITY): Payer: Medicare Other

## 2011-04-11 LAB — LACTIC ACID, PLASMA: Lactic Acid, Venous: 1.5 mmol/L (ref 0.5–2.2)

## 2011-04-11 LAB — CBC
HCT: 35.2 % — ABNORMAL LOW (ref 39.0–52.0)
MCHC: 29.8 g/dL — ABNORMAL LOW (ref 30.0–36.0)
Platelets: 250 10*3/uL (ref 150–400)
WBC: 23.4 10*3/uL — ABNORMAL HIGH (ref 4.0–10.5)

## 2011-04-11 LAB — BASIC METABOLIC PANEL
BUN: 10 mg/dL (ref 6–23)
Chloride: 87 mEq/L — ABNORMAL LOW (ref 96–112)
GFR calc Af Amer: >60 mL/min (ref >60)
GFR calc non Af Amer: >60 mL/min (ref >60)
Potassium: 4.1 mEq/L (ref 3.5–5.1)
Sodium: 129 mEq/L — ABNORMAL LOW (ref 135–145)

## 2011-04-12 ENCOUNTER — Inpatient Hospital Stay (HOSPITAL_COMMUNITY): Payer: Medicare Other

## 2011-04-12 DIAGNOSIS — J449 Chronic obstructive pulmonary disease, unspecified: Secondary | ICD-10-CM

## 2011-04-12 DIAGNOSIS — J4489 Other specified chronic obstructive pulmonary disease: Secondary | ICD-10-CM

## 2011-04-12 LAB — DIFFERENTIAL
Basophils Absolute: 0 10*3/uL (ref 0.0–0.1)
Basophils Relative: 0 % (ref 0–1)
Eosinophils Absolute: 0.1 10*3/uL (ref 0.0–0.7)
Lymphocytes Relative: 6 % — ABNORMAL LOW (ref 12–46)
Monocytes Absolute: 0.8 10*3/uL (ref 0.1–1.0)
Neutrophils Relative %: 87 % — ABNORMAL HIGH (ref 43–77)

## 2011-04-12 LAB — LEGIONELLA ANTIGEN, URINE

## 2011-04-12 LAB — STREP PNEUMONIAE URINARY ANTIGEN: Strep Pneumo Urinary Antigen: NEGATIVE

## 2011-04-12 LAB — PROCALCITONIN: Procalcitonin: <0.1 ng/mL

## 2011-04-12 LAB — BASIC METABOLIC PANEL
CO2: 34 mEq/L — ABNORMAL HIGH (ref 19–32)
Chloride: 89 mEq/L — ABNORMAL LOW (ref 96–112)
Creatinine, Ser: 0.8 mg/dL (ref 0.50–1.35)
GFR calc Af Amer: >60 mL/min (ref >60)
Sodium: 128 mEq/L — ABNORMAL LOW (ref 135–145)

## 2011-04-12 LAB — CBC
MCV: 72.3 fL — ABNORMAL LOW (ref 78.0–100.0)
Platelets: 226 10*3/uL (ref 150–400)
RBC: 4.23 MIL/uL (ref 4.22–5.81)
WBC: 13.9 10*3/uL — ABNORMAL HIGH (ref 4.0–10.5)

## 2011-04-13 ENCOUNTER — Inpatient Hospital Stay (HOSPITAL_COMMUNITY): Payer: Medicare Other

## 2011-04-13 DIAGNOSIS — F05 Delirium due to known physiological condition: Secondary | ICD-10-CM

## 2011-04-13 LAB — CBC
MCH: 22.9 pg — ABNORMAL LOW (ref 26.0–34.0)
MCHC: 31.6 g/dL (ref 30.0–36.0)
MCV: 72.7 fL — ABNORMAL LOW (ref 78.0–100.0)
Platelets: 226 10*3/uL (ref 150–400)

## 2011-04-13 LAB — BASIC METABOLIC PANEL
Calcium: 8.4 mg/dL (ref 8.4–10.5)
Creatinine, Ser: 0.54 mg/dL (ref 0.50–1.35)
GFR calc Af Amer: >60 mL/min (ref >60)
GFR calc non Af Amer: >60 mL/min (ref >60)

## 2011-04-13 LAB — URINE CULTURE
Culture  Setup Time: 201208020602
Culture: NO GROWTH
Special Requests: NEGATIVE

## 2011-04-13 LAB — VANCOMYCIN, TROUGH: Vancomycin Tr: 10.9 ug/mL (ref 10.0–20.0)

## 2011-04-13 LAB — SODIUM, URINE, RANDOM: Sodium, Ur: 38 mEq/L

## 2011-04-14 DIAGNOSIS — E871 Hypo-osmolality and hyponatremia: Secondary | ICD-10-CM

## 2011-04-14 LAB — BASIC METABOLIC PANEL
BUN: 8 mg/dL (ref 6–23)
CO2: 32 mEq/L (ref 19–32)
Chloride: 93 mEq/L — ABNORMAL LOW (ref 96–112)
Glucose, Bld: 106 mg/dL — ABNORMAL HIGH (ref 70–99)
Potassium: 3.5 mEq/L (ref 3.5–5.1)

## 2011-04-14 LAB — DIFFERENTIAL
Eosinophils Relative: 8 % — ABNORMAL HIGH (ref 0–5)
Lymphocytes Relative: 14 % (ref 12–46)
Lymphs Abs: 1 10*3/uL (ref 0.7–4.0)
Monocytes Relative: 10 % (ref 3–12)
Neutrophils Relative %: 68 % (ref 43–77)

## 2011-04-14 LAB — PHOSPHORUS: Phosphorus: 2 mg/dL — ABNORMAL LOW (ref 2.3–4.6)

## 2011-04-14 LAB — CBC
HCT: 28.3 % — ABNORMAL LOW (ref 39.0–52.0)
Hemoglobin: 8.9 g/dL — ABNORMAL LOW (ref 13.0–17.0)
RBC: 3.91 MIL/uL — ABNORMAL LOW (ref 4.22–5.81)
WBC: 7.4 10*3/uL (ref 4.0–10.5)

## 2011-04-15 DIAGNOSIS — E871 Hypo-osmolality and hyponatremia: Secondary | ICD-10-CM

## 2011-04-15 DIAGNOSIS — J9620 Acute and chronic respiratory failure, unspecified whether with hypoxia or hypercapnia: Secondary | ICD-10-CM

## 2011-04-15 DIAGNOSIS — J449 Chronic obstructive pulmonary disease, unspecified: Secondary | ICD-10-CM

## 2011-04-15 LAB — CBC
HCT: 29.5 % — ABNORMAL LOW (ref 39.0–52.0)
MCV: 72.7 fL — ABNORMAL LOW (ref 78.0–100.0)
Platelets: 303 10*3/uL (ref 150–400)
RBC: 4.06 MIL/uL — ABNORMAL LOW (ref 4.22–5.81)
WBC: 7.3 10*3/uL (ref 4.0–10.5)

## 2011-04-15 LAB — DIFFERENTIAL
Basophils Relative: 0 % (ref 0–1)
Eosinophils Relative: 9 % — ABNORMAL HIGH (ref 0–5)
Lymphocytes Relative: 15 % (ref 12–46)
Neutrophils Relative %: 63 % (ref 43–77)

## 2011-04-15 LAB — BASIC METABOLIC PANEL
BUN: 5 mg/dL — ABNORMAL LOW (ref 6–23)
CO2: 26 mEq/L (ref 19–32)
Chloride: 97 mEq/L (ref 96–112)
Creatinine, Ser: 0.5 mg/dL (ref 0.50–1.35)
Potassium: 4.2 mEq/L (ref 3.5–5.1)

## 2011-04-15 LAB — MAGNESIUM: Magnesium: 1.8 mg/dL (ref 1.5–2.5)

## 2011-04-16 LAB — DIFFERENTIAL
Basophils Absolute: 0 10*3/uL (ref 0.0–0.1)
Eosinophils Relative: 8 % — ABNORMAL HIGH (ref 0–5)
Lymphocytes Relative: 12 % (ref 12–46)
Lymphs Abs: 1 10*3/uL (ref 0.7–4.0)
Monocytes Relative: 14 % — ABNORMAL HIGH (ref 3–12)

## 2011-04-16 LAB — BASIC METABOLIC PANEL
BUN: 6 mg/dL (ref 6–23)
Chloride: 98 mEq/L (ref 96–112)
GFR calc Af Amer: >60 mL/min (ref >60)
GFR calc non Af Amer: >60 mL/min (ref >60)
Potassium: 4.3 mEq/L (ref 3.5–5.1)
Sodium: 128 mEq/L — ABNORMAL LOW (ref 135–145)

## 2011-04-16 LAB — CBC
HCT: 31.3 % — ABNORMAL LOW (ref 39.0–52.0)
Hemoglobin: 9.8 g/dL — ABNORMAL LOW (ref 13.0–17.0)
RBC: 4.32 MIL/uL (ref 4.22–5.81)
WBC: 8.6 10*3/uL (ref 4.0–10.5)

## 2011-04-17 LAB — BASIC METABOLIC PANEL
CO2: 23 mEq/L (ref 19–32)
Calcium: 8.9 mg/dL (ref 8.4–10.5)
Chloride: 99 mEq/L (ref 96–112)
Creatinine, Ser: 0.56 mg/dL (ref 0.50–1.35)
Glucose, Bld: 89 mg/dL (ref 70–99)

## 2011-04-17 LAB — CULTURE, BLOOD (ROUTINE X 2)
Culture  Setup Time: 201208011640
Culture  Setup Time: 201208011640

## 2011-04-17 LAB — DIFFERENTIAL
Basophils Relative: 0 % (ref 0–1)
Eosinophils Absolute: 0.8 10*3/uL — ABNORMAL HIGH (ref 0.0–0.7)
Eosinophils Relative: 11 % — ABNORMAL HIGH (ref 0–5)
Lymphocytes Relative: 16 % (ref 12–46)
Monocytes Relative: 15 % — ABNORMAL HIGH (ref 3–12)
Neutro Abs: 4 10*3/uL (ref 1.7–7.7)
Neutrophils Relative %: 58 % (ref 43–77)

## 2011-04-17 LAB — CBC
Hemoglobin: 9.9 g/dL — ABNORMAL LOW (ref 13.0–17.0)
MCH: 22.3 pg — ABNORMAL LOW (ref 26.0–34.0)
MCV: 72.5 fL — ABNORMAL LOW (ref 78.0–100.0)
Platelets: 420 10*3/uL — ABNORMAL HIGH (ref 150–400)
RBC: 4.43 MIL/uL (ref 4.22–5.81)
WBC: 7 10*3/uL (ref 4.0–10.5)

## 2011-04-20 ENCOUNTER — Ambulatory Visit: Payer: Medicare Other | Admitting: Pulmonary Disease

## 2011-04-20 ENCOUNTER — Ambulatory Visit (INDEPENDENT_AMBULATORY_CARE_PROVIDER_SITE_OTHER): Payer: Medicare Other | Admitting: General Surgery

## 2011-04-20 ENCOUNTER — Encounter (INDEPENDENT_AMBULATORY_CARE_PROVIDER_SITE_OTHER): Payer: Medicare Other

## 2011-04-20 ENCOUNTER — Encounter (INDEPENDENT_AMBULATORY_CARE_PROVIDER_SITE_OTHER): Payer: Self-pay | Admitting: General Surgery

## 2011-04-20 VITALS — BP 140/90 | HR 120 | Temp 96.6°F | Resp 24 | Ht 62.0 in

## 2011-04-20 DIAGNOSIS — C189 Malignant neoplasm of colon, unspecified: Secondary | ICD-10-CM

## 2011-04-20 NOTE — Patient Instructions (Signed)
Continue with your heart healthy diet and plan on seeing me in one week your breathing is the most pressing current problem he will not need chemotherapy or radiation therapy and I want to see you in one month. Tylenol for pain and narcotics

## 2011-04-20 NOTE — Progress Notes (Signed)
Subjective:     Patient ID: [name], male   DOB: [birth year], 70 y.o.   MRN: [id number]  HPI The patient is now 12 days followed by right colectomy performed [facility] after he was admitted by the hospitalist the patient has severe COPD unfortunately is thin and I was unable to do a small right transverse incision and anastomosis this tumor showed negative nodes approximately a 4 cm tumor and postoperative was satisfactory with the exception of confusion and COPD-type symptoms he did not require respiratory assistance post surgery and is doing nicely at this time his bowels are working satisfactory and I removed his staples and Steri-Strip his incision today   Review of Systems     Objective:   Physical Exam His pulmonary status is the same as preoperative no oxygen at present a well-healed small incision with no signs of infection off the abdomen    Assessment:    The patient's tumor had negative nodes and was confined to the colon and he should not need team or radiation treatments he is aware of this and his daughter who accompanied him understands he was seen by Dr. Dalene Carrow and I think he has a followup appointment with her with his severe COPD I would leave advise treatment if absolutely necessary.     Plan:     followup for weight no restrictions and diet

## 2011-04-24 ENCOUNTER — Ambulatory Visit: Payer: Medicare Other | Admitting: Pulmonary Disease

## 2011-04-26 ENCOUNTER — Encounter: Payer: Self-pay | Admitting: Adult Health

## 2011-04-26 ENCOUNTER — Ambulatory Visit (INDEPENDENT_AMBULATORY_CARE_PROVIDER_SITE_OTHER)
Admission: RE | Admit: 2011-04-26 | Discharge: 2011-04-26 | Disposition: A | Discharge status: Home / Self Care | Payer: Medicare Other | Source: Ambulatory Visit | Attending: Adult Health | Admitting: Adult Health

## 2011-04-26 ENCOUNTER — Ambulatory Visit (INDEPENDENT_AMBULATORY_CARE_PROVIDER_SITE_OTHER): Payer: Medicare Other | Admitting: Adult Health

## 2011-04-26 ENCOUNTER — Other Ambulatory Visit (INDEPENDENT_AMBULATORY_CARE_PROVIDER_SITE_OTHER): Payer: Medicare Other

## 2011-04-26 VITALS — BP 152/76 | HR 87 | Temp 97.1°F | Ht 62.0 in | Wt 124.2 lb

## 2011-04-26 DIAGNOSIS — C189 Malignant neoplasm of colon, unspecified: Secondary | ICD-10-CM

## 2011-04-26 DIAGNOSIS — J449 Chronic obstructive pulmonary disease, unspecified: Secondary | ICD-10-CM

## 2011-04-26 DIAGNOSIS — J4489 Other specified chronic obstructive pulmonary disease: Secondary | ICD-10-CM

## 2011-04-26 LAB — CBC WITH DIFFERENTIAL/PLATELET
Basophils Absolute: 0.1 10*3/uL (ref 0.0–0.1)
Eosinophils Absolute: 0.4 10*3/uL (ref 0.0–0.7)
Lymphocytes Relative: 17.2 % (ref 12.0–46.0)
MCHC: 31.8 g/dL (ref 30.0–36.0)
Neutrophils Relative %: 66.6 % (ref 43.0–77.0)
RDW: 32.8 % — ABNORMAL HIGH (ref 11.5–14.6)

## 2011-04-26 LAB — BASIC METABOLIC PANEL
Chloride: 97 mEq/L (ref 96–112)
Creatinine, Ser: 0.8 mg/dL (ref 0.4–1.5)
Potassium: 4.7 mEq/L (ref 3.5–5.1)

## 2011-04-26 NOTE — Assessment & Plan Note (Signed)
Recommended to increased Advair to Twice daily  Dosing.  cxr today to follow up on previous aspdz/atx on cxr   Plan;  Increase Advair 250/40mcg 1 puff Twice daily   Continue on Spiriva daily -1 puff  Advance activity as tolerated.  follow up with Primary doctor- Dr. Christell Constant in next 1-2 weeks  follow up with Dr. Zachery Dakins as planned  follow up with Dr. Dalene Carrow as planned.  follow up Dr. Vassie Loll  In 4 weeks and As needed

## 2011-04-26 NOTE — Patient Instructions (Addendum)
Increase Advair 250/63mcg 1 puff Twice daily   Continue on Spiriva daily -1 puff  Advance activity as tolerated.  follow up with Primary doctor- Dr. Christell Constant in next 1-2 weeks  follow up with Dr. Zachery Dakins as planned  follow up with Dr. Dalene Carrow as planned.  follow up Dr. Vassie Loll  In 4 weeks and As needed

## 2011-04-26 NOTE — Progress Notes (Signed)
  Subjective:    Patient ID: [name], male    DOB: 09/18/1940, 70 y.o.   MRN: [id number]  HPI 70/M, heavy ex smoker , quit 1992 for evaluation of COPD & asbestos exposure. He report sincreasing dyspnea for the last few years & now can barely walk a few feet. He was diagnosed with COPD several years ago & is maintained on advair bid & ventolin prn - he uses this 5-6 times daily. He takes lisinopril for hypertension & verapamil. He denies frequent chest colds or recent hospitalisatins. Daughter reports some wt loss - unquantified, fatigue & sleepy all the time. Sprieomtry showed severe airway obstruction- FEv1 24%. 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 He was admitted 7/23-04/17/11 for severe weakness, and anemia with Hbg at 5.8. 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 He required blood transfusion. He had some mild copd flare w/ acute on chronic hypoxic resp. Failure. Tx w/ nebs and flutter. Ace was discontinued. He improved post op and did not require O2 at discharge. 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 HCTZ was stopped. Given IV fluids. NA improved   Since discharge he says he is feeling better. Activity level is improving. Appetite is also improving. He has seen Dr. Zachery Dakins with staple removal has follow up in 3 weeks . Also waiting for dr. Lalla Brothers to phone with ov date. Reminded pt needs follow up with PCP for follow up.     Constitutional:   No  weight loss, night sweats,  Fevers, chills, fatigue, or  lassitude.  HEENT:   No headaches,  Difficulty swallowing,  Tooth/dental problems, or  Sore throat,                No sneezing, itching, ear ache, nasal congestion, post nasal drip,   CV:  No chest pain,  Orthopnea, PND, swelling in lower extremities, anasarca, dizziness, palpitations, syncope.   GI  No heartburn,  indigestion, abdominal pain, nausea, vomiting, diarrhea, bloody stools.   Resp: No coughing up of blood.  No change in color of mucus.  No wheezing.  No chest wall deformity  Skin: no rash or lesions.  GU: no dysuria, change in color of urine, no urgency or frequency.  No flank pain, no hematuria   MS:  No joint pain or swelling.  No decreased range of motion.    Psych:  No change in mood or affect. No depression or anxiety.   .            Objective:   Physical Exam Gen. Pleasant, elderly , chronically ill appearing, in no distress, normal affect, in wheelchair ENT - no lesions, no post nasal drip Neck: No JVD, no thyromegaly, no carotid bruits Lungs: no use of accessory muscles, no dullness to percussion, decreased  without rales or rhonchi  Cardiovascular: Rhythm regular, heart sounds  normal, no murmurs or gallops, no peripheral edema Abdomen: soft and non-tender, no hepatosplenomegaly, BS normal , well healing surgical scar  Musculoskeletal: No deformities, no cyanosis or clubbing Neuro:  alert, non focal        Assessment & Plan:

## 2011-05-04 ENCOUNTER — Encounter (INDEPENDENT_AMBULATORY_CARE_PROVIDER_SITE_OTHER): Payer: Medicare Other | Admitting: General Surgery

## 2011-05-10 DIAGNOSIS — J189 Pneumonia, unspecified organism: Secondary | ICD-10-CM

## 2011-05-10 HISTORY — DX: Pneumonia, unspecified organism: J18.9

## 2011-05-17 ENCOUNTER — Encounter (INDEPENDENT_AMBULATORY_CARE_PROVIDER_SITE_OTHER): Payer: Self-pay | Admitting: General Surgery

## 2011-05-17 NOTE — Discharge Summary (Signed)
[name], [name] NO.:  [id number]  MEDICAL RECORD NO.:  [id number]  LOCATION:  1416                         FACILITY:  [facility]  PHYSICIAN:  Ramiro Harvest, MD    DATE OF BIRTH:  Jan 18, 1941  DATE OF ADMISSION:  04/02/2011 DATE OF DISCHARGE:  04/17/2011                        DISCHARGE SUMMARY    PRIMARY CARE PHYSICIAN:  Dr. Candie Echevaria of [facility].  DISCHARGE DIAGNOSES: 1. Adenocarcinoma of the colon, status post right colectomy through a     limited right transverse incision with ileotransverse colonic     anastomosis, April 09, 2011 per Dr. Zachery Dakins. 2. Acute on chronic respiratory failure, resolved. 3. 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 4. Postop ileus, resolved. 5. Iron deficiency anemia secondary to problem #1. 6. Hypertension. 7. Urinary retention, resolved. 8. 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 Vassie Loll. 9. History of melanoma, status post removal in 2001 related to the     prior history of Hemoccult positive stools. 10.Remote history of tobacco abuse. 11.Enterogastritis, EGD of April 05, 2011. 12.Status post hemorrhoidectomy. 13.Status post melanoma excision in 2001.  DISCHARGE MEDICATIONS: 1. Iron sulfate 325 mg p.o. t.i.d. 2. Flomax 0.4 mg p.o. daily. 3. Verapamil 240 mg p.o. daily. 4. Xopenex inhaler 2 puffs q.4 h. p.r.n. 5. Advair Diskus 250/50 mcg 1 puff q.h.s. 6. Multivitamins 1 tablet p.o. daily. 7. Spiriva 18 mcg inhalation daily. 8. Tylenol Extra Strength 500 mg 1 to 2 tablets p.o. q.6 h. p.r.n.  DISPOSITION AND FOLLOWUP:  The patient will be discharged home.  The patient is to follow up with [facility] on April 20, 2011 at 9 a.m. for staple removal and then to follow up with Dr. Zachery Dakins on May 04, 2011 at 8:20 a.m.  The patient is also to follow up with Dr. Dalene Carrow of Hematology/Oncology  in approximately 6 weeks.  The patient will be called in an appointment time.  The patient is to also follow up with his PCP in the next 1 to 2 weeks and followup patient's blood pressure will need to be reassessed.  The patient will need a repeat CBC and a BMET done to follow up on his electrolytes, renal function, and his hemoglobin.  The patient is to follow up with his lung doctor, Dr. Vassie Loll next week as scheduled.  CONSULTATIONS DONE: 1. A pulmonary consultation was done.  The patient was seen in     consultation by Dr. Sherene Sires on April 06, 2011. 2. A GI consultation was done.  The patient was seen in consultation     by Dr. Evette Cristal on April 04, 2011. 3. A general surgical consultation was done.  The patient was seen in     consultation by Dr. Chevis Pretty on April 06, 2011. 4. A Hematology/Oncology consultation was made.  The patient was seen     by Dr. Dalene Carrow on April 10, 2011.  PROCEDURES PERFORMED: 1. A colonoscopy with biopsy and [place] ink injection was done, April 05, 2011 that showed cecal mass, most probably carcinoma and    diverticulosis  of the colon. 2. An EGD was done on April 05, 2011 per Dr. Evette Cristal that showed antral     gastritis, otherwise normal upper endoscopy seen on this exam that     would explain his anemia. 3. A right colectomy was done of the adenocarcinoma of the cecum, was     done on April 09, 2011 per Dr. Zachery Dakins.  The patient was also     transfused a total of 4 units of packed red blood cells. 4. The patient had a chest x-ray done, April 02, 2011 that showed a     severe COPD, interval increasing right middle lobe airspace     disease, which may be atelectasis or pneumonia. 5. CT angiogram done on April 03, 2011, she was negative for acute PE     or thoracic aortic dissection, coronary and aortic calcifications,     central lobular emphysema with patchy right middle and lower lobe     airspace disease consistent with pneumonia, small right pleural      effusion. 6. CT of the abdomen and pelvis was done, April 06, 2011 that showed     short segment annular constricting lesion involving the cecum near     the ileocecal valve suspicious for the patient's known colon     carcinoma.  No evidence of metastatic disease.  Small amount of     pelvic ascites, small right pleural effusion and right lower lobe     atelectasis versus infiltrate. 7. Chest x-ray done, April 08, 2011, showed COPD, hyperinflation,     bibasilar, and right middle lobe streaky airspace disease     concerning for pneumonia, developing small pleural effusions. 8. Chest x-ray done, April 09, 2010 showed an increasing bibasilar     infiltrates suspicious for pneumonia. 9. Chest x-ray done, April 11, 2011, showed no change in bibasilar     infiltrates. 10.Chest x-ray done, April 12, 2011, showed bibasilar airspace     disease.  It is unchanged. 11.Acute abdominal series done, April 13, 2011, a small amount of free     intraperitoneal air presumably from recent surgery, nonspecific     bowel gas pattern, question mild ileus, COPD.  BRIEF ADMISSION HISTORY AND PHYSICAL:  Mr. [name] is a 70 year old gentleman with past medical history of COPD and melanoma removed from his back in approximately 20 years ago, which was not distantly metastatic.  The patient was told for the past years that he had been anemic and never required blood transfusions in the past.  He was told that he did have some blood in his stools, although he did not have it on the day of admission.  He never noticed any melena or bright red blood per rectum, did note 20-pound weight loss over the past 6 months. Also, had had colonoscopy done.  He does have some shortness of breath, which is his baseline because of his COPD.  The patient does not have any other oxygen, also was noticed that his left leg had been swelling for the past month and may be less.  For the rest of admission history and physical,  please see H and P dictated by Dr. Adela Glimpse of job number [id number].  HOSPITAL COURSE: 1. Adenocarcinoma of the colon.  The patient was admitted for anemia     workup.  Anemia panel, which was obtained was consistent with iron     deficiency anemia.  The patient had not had a colonoscopy done  before in the past.  The admission hemoglobin was 5.8 and B12 and     folate levels, which were obtained were normal.  The patient was     typed and crossed and transfused a total of 4 units during this     hospitalization with appropriate response.  FOBT during this     hospitalization was negative.  There was a concern for colon cancer     and as such a GI consultation was obtained.  The patient was seen     in consultation by Dr. Evette Cristal on April 04, 2011.  Colonoscopy, which     was done at that time, showed a cecal mass, most probably carcinoma     and diverticulosis of the colon and cecal mass.  Biopsies were sent     to Pathology, which were consistent with adenocarcinoma of the     colon.  A general surgery consultation was subsequently obtained on     April 06, 2011 per Dr. Carolynne Edouard.  He had recommended a staging CT scan     of the abdomen and pelvis, which had been done and was negative.     Given the patient's size of tumor, it is very unlikely that there     may be any metastatic disease.  A CEA level, which was subsequently     obtained was at 1.2.  The patient underwent surgery and those were     also sent for Pathology.  The patient had a right colectomy done     per General Surgery.  Due to the patient's tenuous COPD, he was     placed in the unit where he was monitored and followed.  The     patient did not have a bowel movement initially.  Subsequently, he     had a postop ileus, which has since resolved.  The patient is     tolerating p.o.'s and has no signs of bleeding.  His hemoglobin has     remained stable.  He will follow up with General Surgery and Dr.     Dalene Carrow as an outpatient  for further evaluation and management of     his colonic adenocarcinoma.  The patient will be discharged in     stable and improved condition. 2. Acute on chronic respiratory failure secondary to stage IV COPD,     remained stable. 3. 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  On admission, during the     hospitalization, was noted to be hyponatremic.  It was felt this     was likely secondary to volume depletion.  The patient was placed     on IV fluids.  His HCTZ was discontinued and he was monitored.  The     patient improved post surgery and was followed by pulmonary in ICU.     His diet was subsequently advanced as his postop ileus improved.     He was passing gas and had a bowel movement.  The patient was     tolerating p.o.'s okay.  His abdominal pain had improved and he     will be discharged in stable and improved condition, to follow up     with General Surgery as an outpatient.  The patient also was seen     by Oncology as there was a concern for colonic cancer and was     recommended that the patient follow up as an outpatient in about 4     to 6 weeks for  followup on his adenocarcinoma with Dr. Dalene Carrow.  The     patient will be discharged in stable and improved condition. 4. Acute on chronic respiratory failure due to the patient's bad COPD.     A pulmonary consultation was obtained.  The patient was seen in     consultation by Dr. Sherene Sires on April 06, 2011 at which point in time,     some adjustments were made to his bronchodilators.  The patient was     placed on nebulizer treatment instead and a flutter valve was     added.  He was also placed on CPAP.  His lisinopril was     discontinued.  He was maintained on a proton pump inhibitor as     well.  The patient remained in stable condition postop.  The     patient did have some hypoxic issues and had to be placed on the     BiPAP temporary; however, his O2 was weaned.  He improved     clinically, was subsequently transferred to the  floor where he did     well.  The patient will be discharged in stable and improved     condition, to follow up with his pulmonologist as an outpatient. 5. Hyponatremia.  During the hospitalization, the patient was noted to     be hyponatremic.  There was a concern of SIADH and as such the     patient was placed on the fluid restriction.  His HCTZ was     subsequently discontinued and his IV fluids were changed from half-     normal saline to normal saline.  His IV fluids were subsequently     saline locked.  His fluid restriction was 1200 cc daily and then     decreased to about 800 cc daily.  The patient's hyponatremia     improved.  The patient did not have any complications from his     hyponatremia.  The patient will be discharged home in stable and     improved condition, to follow up with his PCP as an outpatient. 6. Postop ileus.  During the hospitalization postoperatively, the     patient did not have any bowel movement, did not pass any gas.  He     was monitored and followed.  Acute abdominal series was obtained,     which was negative for any obstruction.  The patient's narcotics     were minimized and subsequently discontinued.  The patient was     mobilized.  The patient started to have bowel movements with     resolution of his postop ileus. 7. Iron deficiency anemia secondary to problem #1. 8. Urinary retention.  During the hospitalization, the patient was     noted to have urinary retention.  Narcotic have been discontinued.     He was placed on Flomax and a Foley catheter was inserted.  The     patient's urinary retention improved.  He will need to follow up     with his PCP as an outpatient as the patient is being discharged on     Flomax.  The rest of the patient's issues have remained stable throughout the hospitalization.  The patient will be discharged in stable and improved condition.  On day of discharge, vital signs, temperature 98.8, pulse of 102,  blood pressure 134/91, respirations 18, and saturating 94% on room air.  DISCHARGE LABORATORY DATA:  Blood cultures negative x2.  BMET, sodium  130, potassium 4.1, chloride 99, bicarbonate 23, glucose 89, BUN 7, creatinine 0.56, and calcium of 8.9.  CBC with a white count of 7, hemoglobin 9.9, hematocrit 32.1, and a platelet count of 420.  It has been a pleasure taking care of [name].     Ramiro Harvest, MD     DT/MEDQ  D:  04/17/2011  T:  04/17/2011  Job:  [id number]  cc:   Candie Echevaria, DO Fax: [phone]  Laurice Record, M.D. Fax: [phone]  Anselm Pancoast. Zachery Dakins, M.D. [address], [address] [place] [address]  Graylin Shiver, M.D. Fax: [phone]  Electronically Signed by Ramiro Harvest MD on 05/17/2011 12:10:41 PM

## 2011-05-18 ENCOUNTER — Encounter (INDEPENDENT_AMBULATORY_CARE_PROVIDER_SITE_OTHER): Payer: Self-pay | Admitting: General Surgery

## 2011-05-18 ENCOUNTER — Ambulatory Visit (INDEPENDENT_AMBULATORY_CARE_PROVIDER_SITE_OTHER): Payer: Medicare Other | Admitting: General Surgery

## 2011-05-18 VITALS — BP 128/78 | HR 86 | Wt 116.2 lb

## 2011-05-18 DIAGNOSIS — C189 Malignant neoplasm of colon, unspecified: Secondary | ICD-10-CM

## 2011-05-18 NOTE — Progress Notes (Signed)
Subjective:     Patient ID: [name], male   DOB: 1940/09/17, 70 y.o.   MRN: [id number]  HPIPatient returns now proximally 6 weeks following a right colectomy Y. was L.-and he's done nicely. The patient is a severe COPD patient with home oxygen but he has no R. showed tight with a male and is not dyspneic. He is on multiple chronic medications for his breathing but I expect the correction of his anemia has greatly improved his pulmonary status   Review of SystemsNo change     Objective:   Physical Exam Patient's physical exam is greatly improved he is not dyspneic and has small right transverse incision is well healed with no evidence of any weakness on rectal examination his stool is Hemoccult negative he's had a previous hemorrhoidectomy. His weight is up approximately 2 times and he still tight and iron to correct his iron deficiency anemia BP 128/78  Pulse 86  Wt 116 lb 4 oz (52.731 kg)     Assessment:    Satisfactory progress following a right colectomy for a carcinoma of the right colon lymph nodes were negative and the patient is not receiving chemotherapy. His COPD is greatly improved     Plan:       Followup appointment in mid December

## 2011-05-18 NOTE — Patient Instructions (Signed)
Continue her MiraLax and dietary management.

## 2011-05-22 NOTE — Consult Note (Signed)
[name], [name]                 ACCOUNT NO.:  [id number]  MEDICAL RECORD NO.:  [id number]  LOCATION:  1235                         FACILITY:  [facility]  PHYSICIAN:  Laurice Record, M.D.DATE OF BIRTH:  [birth year]  DATE OF CONSULTATION:  04/10/2011 DATE OF DISCHARGE:                                CONSULTATION   REFERRING PHYSICIAN:  [facility]  REASON FOR CONSULTATION:  Colon cancer.  HISTORY OF PRESENT ILLNESS:  [name] is a 70 year old man with severe COPD as well as a history of melanoma of the right back, with right axillary sentinel node biopsy showing no apparent recurrence to date. The patient was admitted for further evaluation of symptomatic anemia. On April 02, 2011, his H&H was 5.8 and 20.2 respectively.  [name] did have a 20-pound weight loss over the last 6 months, but denied anorexia.  On April 05, 2011, [name] received an EGD with colonoscopy.  His EGD revealed mild gastritis.  His colonoscopy, however, revealed a 2.5 to 3 cm mass in the cecum.  After respiratory issues had stabilized, [name] received a right colectomy on April 09, 2011.  The pathology is pending.  A preop CT scan of the chest, abdomen, and pelvis revealed an annular obstructive lesion involving the cecum near the ileocecal valve.  There was no evidence of metastatic disease.  Preop CEA was 1.2.  We were asked to see the patient in consultation, anticipating malignancy.  PAST MEDICAL HISTORY: 1. Severe COPD. 2. Anemia with recent heme-positive stools. 3. Remote tobacco. 4. History of melanoma at the right back, status post wide excision by     Dr. Luan Pulling. 5. Antral gastritis per EGD on April 05, 2011.  PAST SURGICAL HISTORY: 1. Status post right colectomy on April 09, 2011, Dr. Zachery Dakins. 2. Status post hemorrhoidectomy. 3. Status post melanoma excision in 2001.  ALLERGIES:  BP MEDICATIONS, unknown exact name of medications.  MEDICATIONS:  Enteric Brovana, Pulmicort, Mefoxin, Lovenox,  iron sulfate, Lasix, hydrochlorothiazide, Cozaar, magnesium sulfate, morphine sulfate, Protonix, nicotine patch, verapamil, Tylenol, Ventolin, Xanax, Benadryl,  Robitussin, Narcan, Zofran, Compazine.  REVIEW OF SYSTEMS:  [name] denies any fever, chills, night sweats, or headaches.  No mental status changes or vision changes.  No dysphagia. His main complaint is dyspnea on exertion with minimal movement, shortness of breath with intermittent desaturation.  No productive cough or chest pain.  No palpitations.  [name] becomes anxious at times secondary to shortness of breath.  No abdominal pain.  No failure to thrive or decrease in appetite, despite a 20-pound weight loss over the last 6 months accompanied by fatigue.  No nausea, vomiting, diarrhea, or constipation.  [name] denies any blood in the stools or in the urine.  No gum bleed.  [name] does occasionally have nosebleed secondary to dry nose. No hemoptysis.    FAMILY HISTORY:  Unknown, the patient is adopted.  SOCIAL HISTORY:  The patient is married, [name] has one daughter.  Full code.  [name] chews tobacco after having quit the use of cigarettes in 1991, at which time [name] was smoking two packs a day of cigarettes for 35 years. No alcohol or recreational drug use.  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  PHYSICAL EXAMINATION:  GENERAL:  [name], in mild respiratory discomfort, alert and oriented x3. VITAL SIGNS:  Blood pressure 153/77, pulse 118, respirations 16, temperature 98.8, O2 sat 96% on 5 L. HEENT:  Normocephalic, atraumatic.  PERRLA.  Oral cavity without thrush or lesions.  [name] has poor dentition. NECK:  Supple.  No cervical or supraclavicular masses. LUNGS:  Remarkable for bibasilar rales.  No wheezing or rhonchi.  No axillary masses. CARDIOVASCULAR:  Regular rate and rhythm without murmurs, rubs, or gallops. ABDOMEN:  Soft, nontender.  Bowel sounds x4.  No  hepatosplenomegaly. EXTREMITIES:  No clubbing or cyanosis or edema.  No inguinal masses. GU and RECTAL:  Deferred. SKIN:  With no new suspicious lesions.  No bruising or petechial rash. NEURO:  Nonfocal. LYMPH NODES: no adenopathy.  LABORATORY DATA:  Hemoglobin 10.6, hematocrit 34.2, white count 19.5, platelets 281,000, MCV 72.6, ANC 10, lymphocytes 0.9, monocytes 0.8 for a white count of 11.7 on April 02, 2011, iron 10, TIBC 420, percentage saturation 2, ferritin 8, folic acid greater than 20, B12 386, INR 1.04, PT 13.8, PTT 31.  Sodium 126, it was 124 on admission; potassium 4.1; chloride 92; CO2 29, BUN 4, creatinine 0.8, glucose 105.  Total bilirubin 0.7, alkaline phosphatase 53, AST 19, ALT 11, total protein 5.4, albumin 2.6, calcium 8.4, magnesium 1.1.  TSH 2.045.  CEA 1.2 on April 06, 2011.  Hemoccult negative on April 04, 2011.  Chest x-ray shows bibasilar infiltrate, suspicious for pneumonia.  ASSESSMENT/PLAN: 1. Dr. Dalene Carrow has seen and evaluated the patient.  This is a 70-year-     old man with severe COPD, admitted for further evaluation of     symptomatic anemia.  The patient had a colonoscopy which revealed a     cecal mass, which after respiratory issues were stabilized, [name]     received the right colectomy on April 09, 2011.  The pathology     report currently is pending, which will determine if the patient is     a candidate for adjuvant chemotherapy.  If [name] is, will need to     determine if [name] can receive therapy with underlying respiratory     compromise.  Further discussion regarding the treatment can be done     in the outpatient setting.  Will set the patient to follow up with     [place] in about 4-week time. 2. COPD with emphysema. 3. Iron-deficiency anemia, on oral iron. 4. History of melanoma.  All the questions were answered.  Thank you very much for the consultation.  Please feel free to call if you have any questions or concerns.     Marlowe Kays,  PA-C   ______________________________ Laurice Record, M.D.    SW/MEDQ  D:  04/11/2011  T:  04/11/2011  Job:  [id number]  cc:   [facility]  Electronically Signed by Marlowe Kays P.A. on 04/12/2011 10:04:34 AM Electronically Signed by Arlan Organ M.D. on 05/22/2011 10:08:29 AM

## 2011-05-23 NOTE — Op Note (Signed)
  NAMEKEDRICK, MCNAMEE NO.:  [id number]  MEDICAL RECORD NO.:  [id number]  LOCATION:  1507                         FACILITY:  [facility]  PHYSICIAN:  Graylin Shiver, M.D.   DATE OF BIRTH:  [birth year]  DATE OF PROCEDURE:  04/05/2011 DATE OF DISCHARGE:                              OPERATIVE REPORT   PROCEDURE:  Esophagogastroduodenoscopy.  INDICATIONS FOR PROCEDURE:  Severe anemia, rule out upper GI lesion.  Informed consent was obtained after explanation of the risks of bleeding, infection and perforation.  PREMEDICATIONS:  The patient was sedated by the Anesthesia Department using propofol.  DESCRIPTION OF PROCEDURE:  With the patient in the left lateral decubitus position, the Pentax gastroscope was inserted into the oropharynx and passed into the esophagus.  It was advanced down the esophagus, then into the stomach and into the duodenum.  The second portion and bulb of the duodenum looked normal.  The antrum of the stomach showed some antral gastritis but nothing severe and the body of the stomach looked normal, as did the fundus and cardia.  The esophagus looked normal in its entirety.  He tolerated the procedure well without complications.  IMPRESSION:  Antral gastritis, otherwise, normal upper endoscopy.  I see nothing on this exam that would explain anemia.          ______________________________ Graylin Shiver, M.D.     SFG/MEDQ  D:  04/05/2011  T:  04/05/2011  Job:  [id number]  cc:   [facility]  Electronically Signed by Herbert Moors MD on 05/23/2011 08:50:28 AM

## 2011-05-23 NOTE — Op Note (Signed)
  NAMEJALYN, [name] NO.:  [id number]  MEDICAL RECORD NO.:  [id number]  LOCATION:  1507                         FACILITY:  [facility]  PHYSICIAN:  Graylin Shiver, M.D.   DATE OF BIRTH:  [birth year]  DATE OF PROCEDURE:  04/05/2011 DATE OF DISCHARGE:                              OPERATIVE REPORT   PROCEDURE:  Colonoscopy with biopsy and [place] ink injection.  INDICATIONS FOR PROCEDURE:  Anemia with a prior history of heme-positive stool.  Informed consent was obtained after explanation of the risks of bleeding, infection and perforation.  PREMEDICATIONS:  The patient was sedated by the Anesthesia Department using propofol.  DESCRIPTION OF PROCEDURE:  With the patient in the left lateral decubitus position, a rectal exam was performed and no masses were felt. The Pentax colonoscope was inserted into the rectum and advanced around the colon to the cecum.  Cecal landmarks were identified.  In the cecum across from the ileocecal valve there was a 2.5 to 3 cm ulcerated mass which was firm to palpation.  This looks like an obvious carcinoma. Biopsies were obtained for histology.  The ascending colon looked normal.  The transverse colon showed a few diverticula.  The descending colon and sigmoid showed numerous diverticula in the colon.  The rectum looked normal.  Retroflexion was normal.  I did [place] ink area where the cecal mass was after I biopsied for localization for the surgeon.  He tolerated the procedure well without complications.  IMPRESSION:  Cecal mass, most probably carcinoma and diverticulosis of the colon.          ______________________________ Graylin Shiver, M.D.     SFG/MEDQ  D:  04/05/2011  T:  04/05/2011  Job:  [id number]  cc:   [facility]  Electronically Signed by Herbert Moors MD on 05/23/2011 08:50:35 AM

## 2011-05-24 ENCOUNTER — Encounter: Payer: Self-pay | Admitting: Pulmonary Disease

## 2011-05-24 ENCOUNTER — Ambulatory Visit (INDEPENDENT_AMBULATORY_CARE_PROVIDER_SITE_OTHER): Payer: Medicare Other | Admitting: Pulmonary Disease

## 2011-05-24 VITALS — BP 114/66 | HR 87 | Temp 98.7°F | Ht 62.0 in | Wt 122.2 lb

## 2011-05-24 DIAGNOSIS — J449 Chronic obstructive pulmonary disease, unspecified: Secondary | ICD-10-CM

## 2011-05-24 NOTE — Progress Notes (Signed)
  Subjective:    Patient ID: [name], male    DOB: 1940/12/08, 70 y.o.   MRN: [id number]  HPI PCP - Christell Constant 70/M, heavy ex smoker , quit 1992 for FU of COPD & recent pneumonia post op colectomy.  He reports increasing dyspnea for the last few years & now can barely walk a few feet. He was diagnosed with COPD several years ago. He takes lisinopril for hypertension & verapamil.  Spirometry showed severe airway obstruction- FEv1 24%.  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  Ace was discontinued. He improved post op and did not require O2 at discharge. 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 HCTZ was stopped. Given IV fluids. NA improved  Since discharge he says he is feeling better. Activity level is improving. Appetite is also improving. He has seen Dr. Zachery Dakins with staple removal  He was again hospitalised at [facility] with RLL pneumonia, given levaquin & predniosne. Dr Christell Constant continued this. He was started on oxygen, is compliant as needed & this helps. Does not want CXR today. Compliant with meds, takes nebs 2-3 /day   Review of Systems Patient denies significant dyspnea,cough, hemoptysis,  chest pain, palpitations, pedal edema, orthopnea, paroxysmal nocturnal dyspnea, lightheadedness, nausea, vomiting, abdominal or  leg pains      Objective:   Physical Exam Gen. Pleasant, well-nourished, in no distress, normal affect ENT - no lesions, no post nasal drip Neck: No JVD, no thyromegaly, no carotid bruits Lungs: no use of accessory muscles, no dullness to percussion, decreased without rales or rhonchi  Cardiovascular: Rhythm regular, heart sounds  normal, no murmurs or gallops, 1+ peripheral edema Abdomen: soft and non-tender, no hepatosplenomegaly, BS normal. Musculoskeletal: No  deformities, no cyanosis or clubbing Neuro:  alert, non focal        Assessment & Plan:

## 2011-05-24 NOTE — Patient Instructions (Signed)
Chest xray in  3 months Taper prednisone to off as discussed Stay on advair & spiriva

## 2011-05-24 NOTE — Assessment & Plan Note (Signed)
Ct advair, spiriva, albuterol nebs prn Ct O2 on ambulation & during sleep Does not want flu shot or penumovax He does not want CXr today - can rpt in 3 months (do not have film from [facility]) Taper prednisone as directed.

## 2011-10-08 ENCOUNTER — Ambulatory Visit: Payer: Medicare Other | Admitting: Pulmonary Disease

## 2012-02-08 ENCOUNTER — Ambulatory Visit (INDEPENDENT_AMBULATORY_CARE_PROVIDER_SITE_OTHER)
Admission: RE | Admit: 2012-02-08 | Discharge: 2012-02-08 | Disposition: A | Discharge status: Home / Self Care | Payer: Medicare Other | Source: Ambulatory Visit | Attending: Pulmonary Disease | Admitting: Pulmonary Disease

## 2012-02-08 ENCOUNTER — Encounter: Payer: Self-pay | Admitting: Pulmonary Disease

## 2012-02-08 ENCOUNTER — Ambulatory Visit (INDEPENDENT_AMBULATORY_CARE_PROVIDER_SITE_OTHER): Payer: Medicare Other | Admitting: Pulmonary Disease

## 2012-02-08 VITALS — BP 118/62 | HR 75 | Temp 97.8°F | Ht 62.0 in | Wt 116.6 lb

## 2012-02-08 DIAGNOSIS — J449 Chronic obstructive pulmonary disease, unspecified: Secondary | ICD-10-CM

## 2012-02-08 DIAGNOSIS — J189 Pneumonia, unspecified organism: Secondary | ICD-10-CM

## 2012-02-08 NOTE — Progress Notes (Signed)
  Subjective:    Patient ID: [name], male    DOB: March 24, 1941, 71 y.o.   MRN: [id number]  HPI  PCP - Christell Constant  71/M, heavy ex smoker , quit 1992 for FU of COPD & recent pneumonia post op colectomy.  He reports increasing dyspnea for the last few years & now can barely walk a few feet. He was diagnosed with COPD several years ago. He takes lisinopril for hypertension & verapamil.  Spirometry showed severe airway obstruction- FEv1 24%.  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 Ace was discontinued. He improved post op and did not require O2 at discharge. 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 HCTZ was stopped. Given IV fluids. NA improved  Since discharge he says he is feeling better. Activity level is improving. Appetite is also improving. He has seen Dr. Zachery Dakins with staple removal  He was again hospitalised at [facility] with RLL pneumonia, given levaquin & predniosne. Dr Christell Constant continued this. He was started on oxygen, is compliant as needed & this helps. Does not want CXR today.   02/08/2012 8 mnth FU Pt states's there's not much improvement in SOB or cough.  Cough is prod with white mucus. Compliant with meds, takes nebs 4 /day , also uses albuterol MDI in between & xopenex MDI - he has settled into this regimen & inspite of a long discussion about adverse effects of too much albuterol, I could not talk him out of this. He assures me that he is not using too much He is mobile at home , does not go out much Use o2 prn only CXR -persistent RLL infiltrate, also noted on CT 8/12   Review of Systems Patient denies significant dyspnea,cough, hemoptysis,  chest pain, palpitations, pedal edema, orthopnea, paroxysmal nocturnal dyspnea, lightheadedness, nausea, vomiting, abdominal or  leg pains        Objective:   Physical Exam  Gen. Pleasant, well-nourished, in no distress, normal affect ENT - no lesions, no post nasal drip Neck: No JVD, no thyromegaly, no carotid bruits Lungs: no use of accessory muscles, no dullness to percussion, decreased without rales or rhonchi  Cardiovascular: Rhythm regular, heart sounds  normal, no murmurs or gallops, no peripheral edema Abdomen: soft and non-tender, no hepatosplenomegaly, BS normal. Musculoskeletal: No deformities, no cyanosis or clubbing Neuro:  alert, non focal       Assessment & Plan:

## 2012-02-08 NOTE — Patient Instructions (Addendum)
Chest xray  Decrease use of albuterol if you can- space them at least 4 h apart

## 2012-02-08 NOTE — Assessment & Plan Note (Addendum)
Decrease use of albuterol if you can - long discussion about this , discussed side effects of albuterol He would benefit from pulm rehab but he cannot be convinced

## 2012-02-10 DIAGNOSIS — R911 Solitary pulmonary nodule: Secondary | ICD-10-CM | POA: Insufficient documentation

## 2012-02-10 NOTE — Assessment & Plan Note (Signed)
He refused FU cxr on last visit Chest xray today - persistent RLL infiltrate - suggest CT scan to r/o post obstructive lesion - alternatively, there may be bronchiectasis in this area.

## 2012-02-12 ENCOUNTER — Telehealth: Payer: Self-pay | Admitting: *Deleted

## 2012-02-12 DIAGNOSIS — J449 Chronic obstructive pulmonary disease, unspecified: Secondary | ICD-10-CM

## 2012-02-12 NOTE — Telephone Encounter (Signed)
Do you want to go ahead and set up CT scan now or wait until he comes in and see TP first? Please advise thanks

## 2012-02-12 NOTE — Telephone Encounter (Signed)
Message copied by Tommie Sams on Tue Feb 12, 2012 11:44 AM ------      Message from: Oretha Milch      Created: Sun Feb 10, 2012  4:23 PM       RLL infiltrate is still present - he does not have obvious symptoms of pneumonia      Suggest CT scan of chest for better evaluation      Pl arrange FU with TP in 1 month

## 2012-02-15 NOTE — Telephone Encounter (Signed)
Pl set up - CT chest  without contrast - prior to appt with TP

## 2012-02-15 NOTE — Telephone Encounter (Signed)
Chest ct 02/25/12@3 :30pm pt's daughter is aware Tobe Sos

## 2012-02-15 NOTE — Telephone Encounter (Signed)
Order has been placed. Please advise PCC thanks 

## 2012-02-21 ENCOUNTER — Other Ambulatory Visit: Payer: Medicare Other

## 2012-02-25 ENCOUNTER — Ambulatory Visit (INDEPENDENT_AMBULATORY_CARE_PROVIDER_SITE_OTHER)
Admission: RE | Admit: 2012-02-25 | Discharge: 2012-02-25 | Disposition: A | Discharge status: Home / Self Care | Payer: Medicare Other | Source: Ambulatory Visit | Attending: Pulmonary Disease | Admitting: Pulmonary Disease

## 2012-02-25 DIAGNOSIS — J4489 Other specified chronic obstructive pulmonary disease: Secondary | ICD-10-CM

## 2012-02-25 DIAGNOSIS — J449 Chronic obstructive pulmonary disease, unspecified: Secondary | ICD-10-CM

## 2012-02-26 ENCOUNTER — Telehealth: Payer: Self-pay | Admitting: Pulmonary Disease

## 2012-02-26 NOTE — Telephone Encounter (Signed)
Scarring & some damage to part of RLL - also seen in 7/12- stable since then FU scan in 6 mnths recommended to show stability

## 2012-02-26 NOTE — Telephone Encounter (Signed)
Ok with me FU in 3 m instead with TP

## 2012-02-26 NOTE — Telephone Encounter (Signed)
Please advise CT Chest results, thanks

## 2012-02-26 NOTE — Telephone Encounter (Signed)
I spoke with [name] and advised still waiting for RA to review the results and will call her once this is done. She verbalized understanding and states nothing further needed.

## 2012-02-26 NOTE — Telephone Encounter (Signed)
Spoke with pt's daughter and notified of recs per RA. Appt cancelled. Nothing further needed.

## 2012-02-26 NOTE — Telephone Encounter (Signed)
Spoke with pt's daughter and notified of results and she verbalized understanding. Wants to know if it's okay to cancel the ov with TP for next wk. Please advise, thanks!

## 2012-02-26 NOTE — Telephone Encounter (Signed)
Pt's step-daughter Chip Boer) called back. Leanora Ivanoff

## 2012-03-03 ENCOUNTER — Ambulatory Visit: Payer: Medicare Other | Admitting: Adult Health

## 2012-03-10 ENCOUNTER — Ambulatory Visit: Payer: Medicare Other | Admitting: Adult Health

## 2012-06-23 IMAGING — CT CT ABD-PELV W/ CM
2 of 6 series · 17 of 46 positions shown, 19 images · IV contrast (agent unspecified)
Comparison: None.

CLINICAL DATA: Right-sided colon carcinoma.  Staging.

CT ABDOMEN AND PELVIS WITH CONTRAST
TECHNIQUE: Multidetector CT imaging of the abdomen and pelvis was
performed following the standard protocol during bolus
administration of intravenous contrast.
Contrast: 100 ml Mmnipaque-7EE and oral contrast

[Series 2: rtn ap with st · axial · 0.74mm/px · z∈[-476,-126]mm · 14 of 80 slices shown, 16 images]
[im 5/80  soft-tissue]
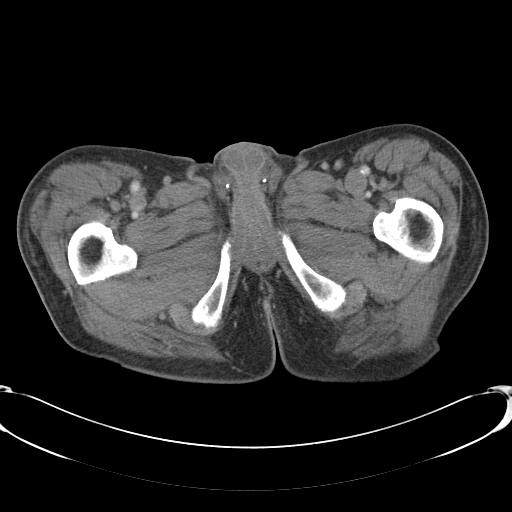
[im 5/80  bone]
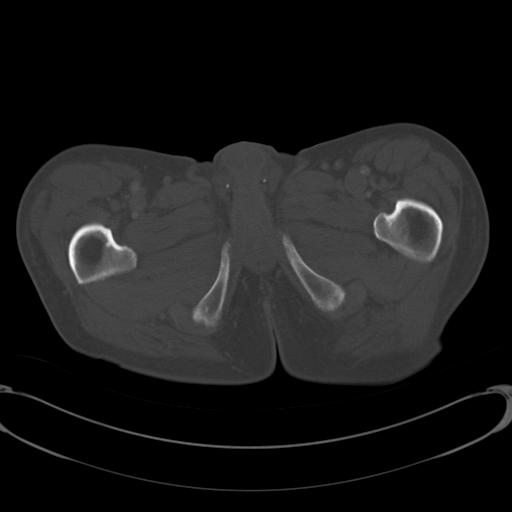
[im 9/80  soft-tissue]
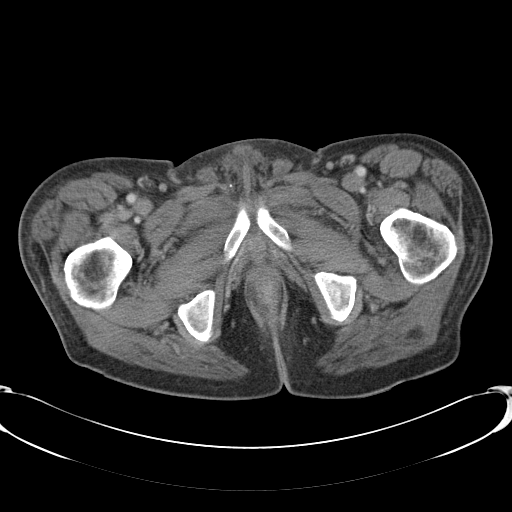
[im 18/80  soft-tissue]
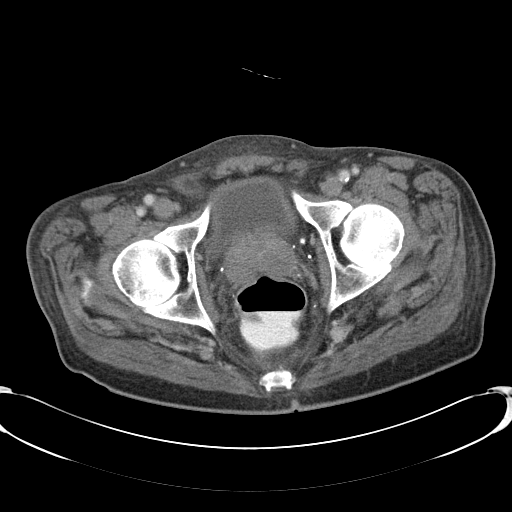
[im 22/80  soft-tissue]
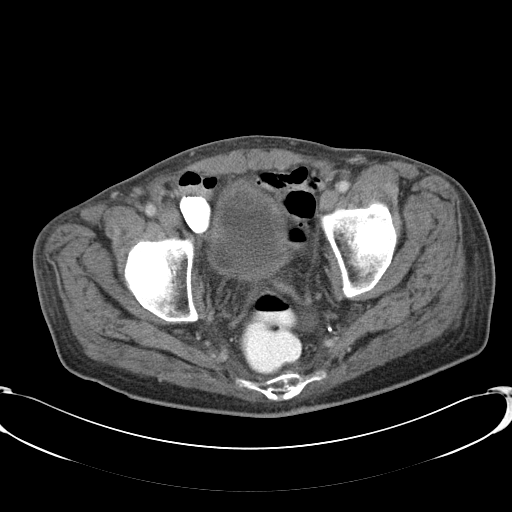
[im 27/80  soft-tissue]
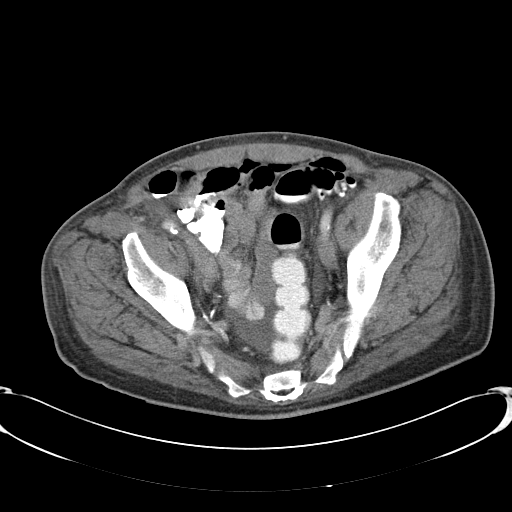
[im 31/80  soft-tissue]
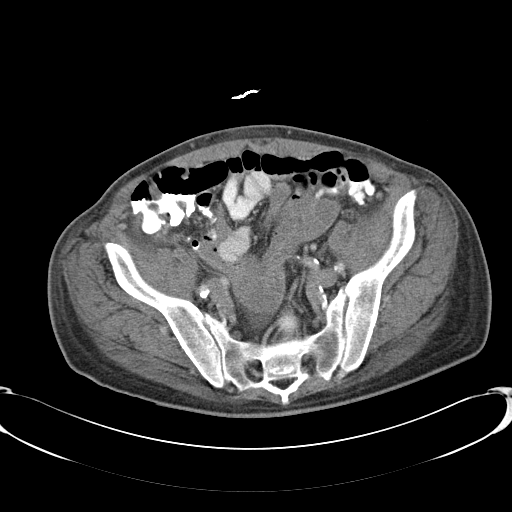
[im 36/80  soft-tissue]
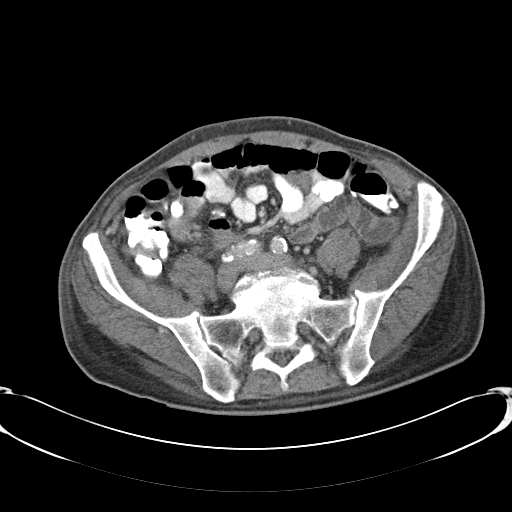
[im 44/80  soft-tissue]
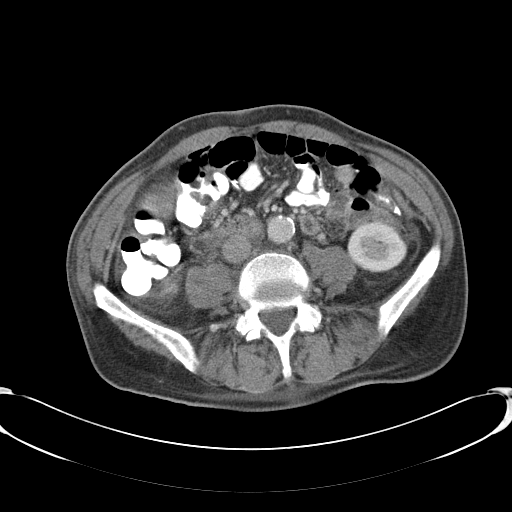
[im 49/80  soft-tissue]
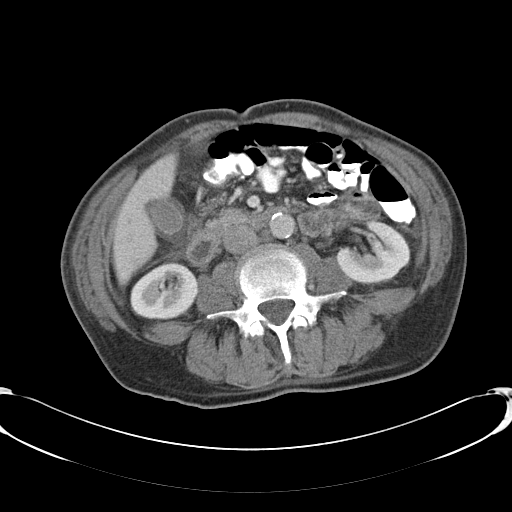
[im 49/80  bone]
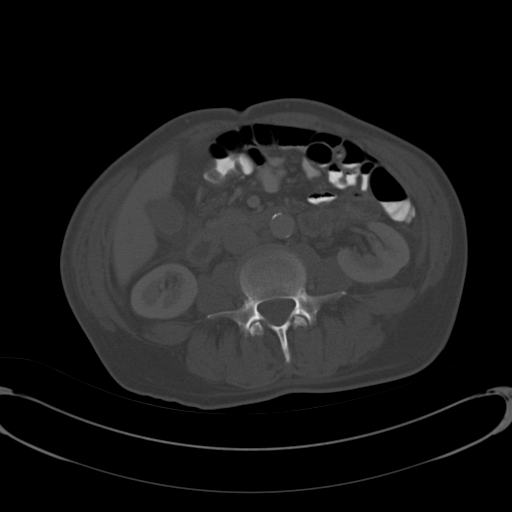
[im 53/80  soft-tissue]
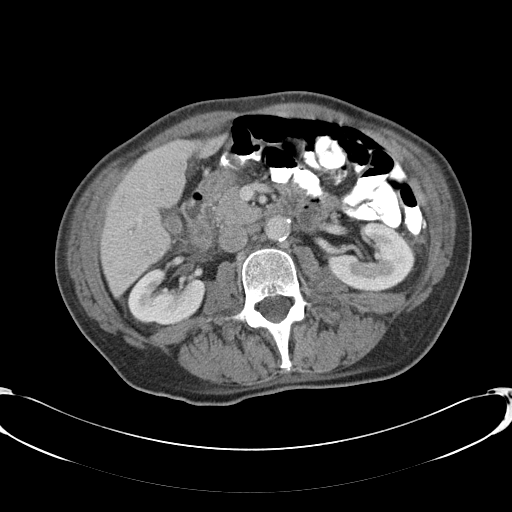
[im 58/80  soft-tissue]
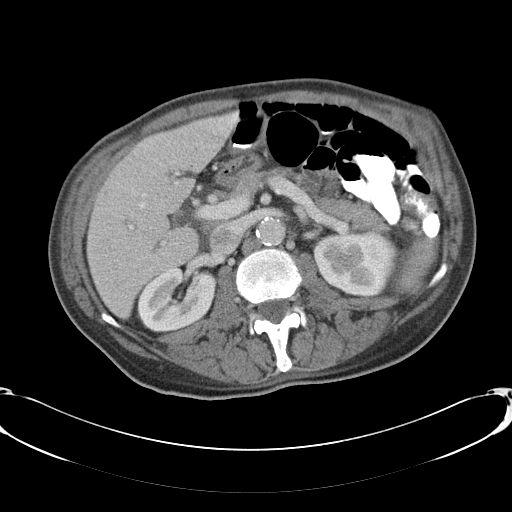
[im 62/80  soft-tissue]
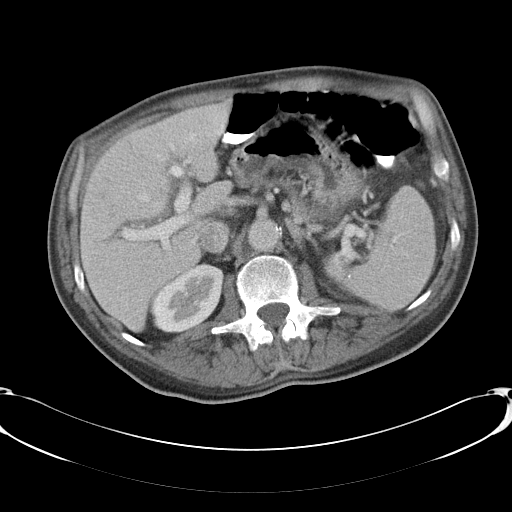
[im 71/80  soft-tissue]
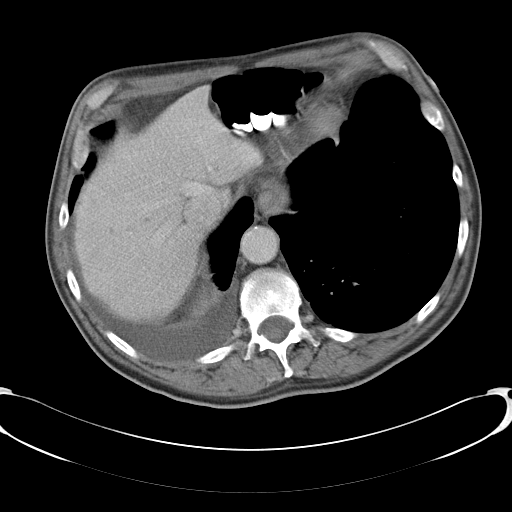
[im 75/80  soft-tissue]
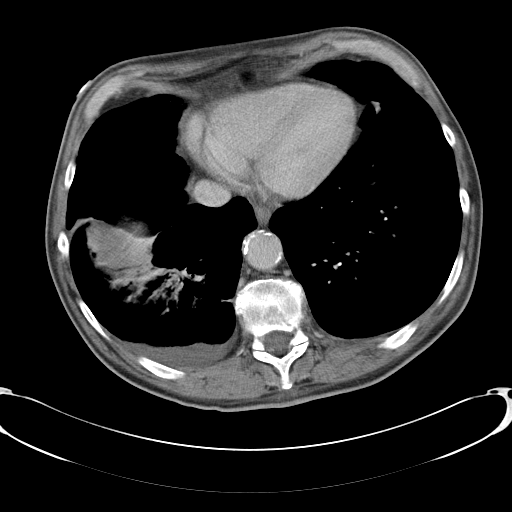

[Series 602: cor · coronal · 0.81mm/px · 3 of 83 slices shown]
[im 28/83  soft-tissue]
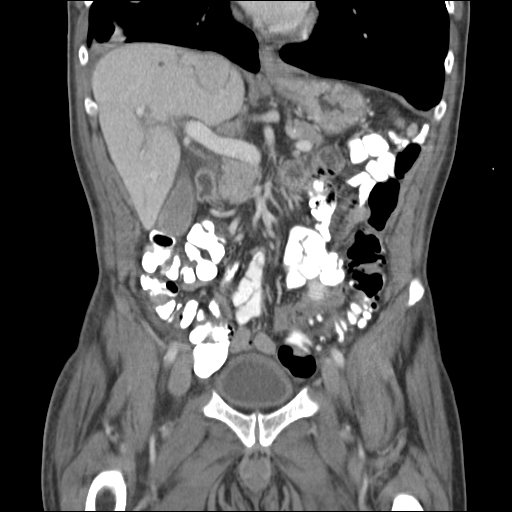
[im 37/83  soft-tissue]
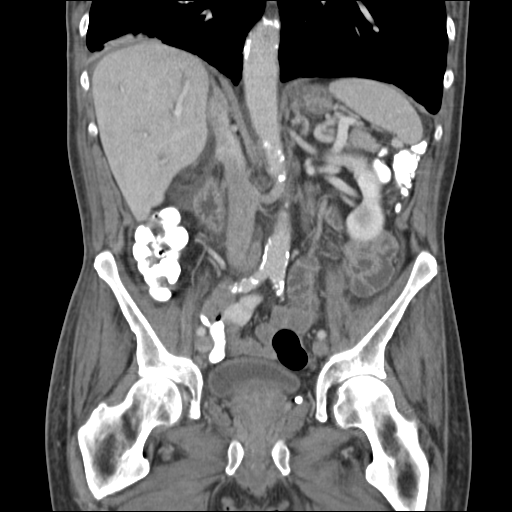
[im 46/83  soft-tissue]
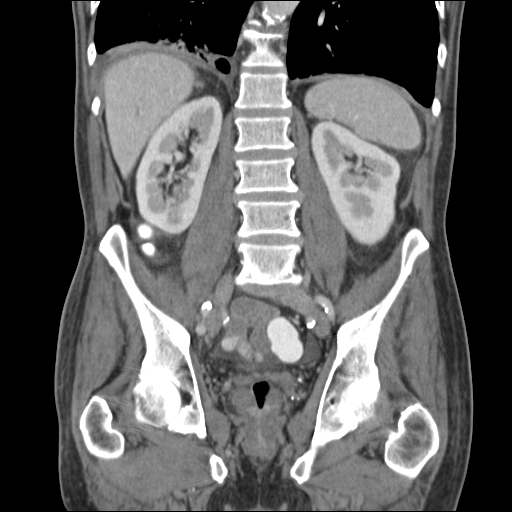

[17 of 46 positions shown; findings below may reference images not displayed]

FINDINGS: A small right pleural effusion is seen as well as right
basilar atelectasis versus infiltrate.

Gallbladder sludge is noted, however gallbladder is partially
contracted.  The liver, spleen, pancreas, adrenal glands, and
kidneys are normal appearance.  No evidence of hydronephrosis.

A short segment annular constricting lesion is seen which involves
the cecum near the level of the ileocecal valve.  This is
suspicious for patient's known colon carcinoma.

No other abnormal areas of bowel wall thickening are seen.
Diverticulosis is seen involving the descending sigmoid colon,
however there is no evidence of diverticulitis.  No other
inflammatory process identified. A small amount of free fluid is
seen in the pelvis and mild diffuse body wall edema is also noted.

No lymphadenopathy or other soft tissue masses are identified
within the abdomen or pelvis.  Prostate gland is mildly enlarged.
No suspicious bone lesions are identified.
IMPRESSION: 1.  Short segment annular constricting lesion involving the cecum
near the ileocecal valve, suspicious for patient's known colon
carcinoma.
2.  No evidence of metastatic disease.
3.  Small amount of pelvic ascites.
4.  Small right pleural effusion and right lower lobe atelectasis
versus infiltrate.

## 2012-07-28 ENCOUNTER — Encounter: Payer: Self-pay | Admitting: Adult Health

## 2012-07-28 ENCOUNTER — Ambulatory Visit (INDEPENDENT_AMBULATORY_CARE_PROVIDER_SITE_OTHER): Payer: Medicare Other | Admitting: Adult Health

## 2012-07-28 VITALS — BP 160/80 | HR 90 | Temp 98.3°F | Ht 61.0 in | Wt 114.8 lb

## 2012-07-28 DIAGNOSIS — J449 Chronic obstructive pulmonary disease, unspecified: Secondary | ICD-10-CM

## 2012-07-28 DIAGNOSIS — J189 Pneumonia, unspecified organism: Secondary | ICD-10-CM

## 2012-07-28 NOTE — Patient Instructions (Addendum)
Follow up for CT next month  Continue on Advair and Spiriva  follow up Dr. Vassie Loll  In 6 months and As needed   Make appointment to see your family doctor for elevated blood pressure this week

## 2012-07-28 NOTE — Assessment & Plan Note (Addendum)
Persistent right lower lobe inflammatory changes CXR  in June 2013 showed post inflammatory changes, right lower lobe with scarring and atelectasis Will repeat CT chest next month

## 2012-07-28 NOTE — Addendum Note (Signed)
Addended by: Boone Master E on: 07/28/2012 05:30 PM   Modules accepted: Orders

## 2012-07-28 NOTE — Progress Notes (Signed)
Subjective:    Patient ID: [name], male    DOB: 05-06-1941, 71 y.o.   MRN: [id number]  HPI PCP - Christell Constant  71/M, heavy ex smoker , quit 1992 for FU of COPD & recent pneumonia post op colectomy.  He reports increasing dyspnea for the last few years & now can barely walk a few feet. He was diagnosed with COPD several years ago. He takes lisinopril for hypertension & verapamil.  Spirometry showed severe airway obstruction- FEv1 24%.  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 Ace was discontinued. He improved post op and did not require O2 at discharge. 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 HCTZ was stopped. Given IV fluids. NA improved  Since discharge he says he is feeling better. Activity level is improving. Appetite is also improving. He has seen Dr. Zachery Dakins with staple removal  He was again hospitalised at [facility] with RLL pneumonia, given levaquin & predniosne. Dr Christell Constant continued this. He was started on oxygen, is compliant as needed & this helps. Does not want CXR today.   02/08/12  8 mnth FU Pt states's there's not much improvement in SOB or cough.  Cough is prod with white mucus. Compliant with meds, takes nebs 4 /day , also uses albuterol MDI in between & xopenex MDI - he has settled into this regimen & inspite of a long discussion about adverse effects of too much albuterol, I could not talk him out of this. He assures me that he is not using too much He is mobile at home , does not go out much Use o2 prn only CXR -persistent RLL infiltrate, also noted on CT 8/12 >>CT chest showed Persistent postinflammatory changes involving the right lower  lobe with scarring, atelectasis and peribronchial thickening. No  discrete mass or acute pneumonia. I would recommend a follow-up    noncontrast chest CT in 6 months. . Stable changes of emphysema and remote granulomatous disease   07/28/2012 Follow up  He returns for six-month followup for COPD Patient states that he is doing fairly well without any increased cough or wheezing or shortness of breath and he underwent a CT chest in June of this year. That showed inflammatory changes of the right lower lobe Patient denies any fever or chest pain, orthopnea, PND, leg swelling, or weight loss. Blood pressure is elevated today. He says that he has recently been taken off his diuretic. He has been advised to follow with his family doctor for an office visit. This week regarding high blood pressure. He denies any headache, visual changes, or speech changes     Review of Systems  Patient denies significant dyspnea,cough, hemoptysis,  chest pain, palpitations, pedal edema, orthopnea, paroxysmal nocturnal dyspnea, lightheadedness, nausea, vomiting, abdominal or  leg pains      Objective:   Physical Exam   Gen. Pleasant, thin frail , in no distress, normal affect ENT - no lesions, no post nasal drip Neck: No JVD, no thyromegaly, no carotid bruits Lungs: no use of accessory muscles, no dullness to percussion, decreased without rales or rhonchi  Cardiovascular: Rhythm regular, heart sounds  normal, no murmurs or gallops, no peripheral edema Abdomen: soft and non-tender, no hepatosplenomegaly, BS normal. Musculoskeletal: No deformities, no cyanosis or clubbing Neuro:  alert, non focal  Assessment & Plan:

## 2012-07-28 NOTE — Assessment & Plan Note (Signed)
Compensated on present regimen Continue on Advair and Spiriva Flu Shot is up-to-date Pneumovax is up-to-date

## 2012-07-28 NOTE — Addendum Note (Signed)
Addended by: Boone Master E on: 07/28/2012 04:36 PM   Modules accepted: Orders

## 2012-08-25 ENCOUNTER — Other Ambulatory Visit: Payer: Medicare Other

## 2013-01-14 ENCOUNTER — Telehealth: Payer: Self-pay | Admitting: Pulmonary Disease

## 2013-01-14 NOTE — Telephone Encounter (Signed)
Called pt x's 3 to make next ov per recall.  Pt never returned calls.  Mailed recall letter 01/14/13. Emily E McAlister °

## 2013-01-23 ENCOUNTER — Encounter: Payer: Self-pay | Admitting: Adult Health

## 2013-01-23 ENCOUNTER — Ambulatory Visit (INDEPENDENT_AMBULATORY_CARE_PROVIDER_SITE_OTHER): Payer: Medicare Other | Admitting: Adult Health

## 2013-01-23 VITALS — BP 142/92 | HR 81 | Temp 97.4°F | Ht 61.0 in | Wt 107.8 lb

## 2013-01-23 DIAGNOSIS — J189 Pneumonia, unspecified organism: Secondary | ICD-10-CM

## 2013-01-23 DIAGNOSIS — J449 Chronic obstructive pulmonary disease, unspecified: Secondary | ICD-10-CM

## 2013-01-23 NOTE — Progress Notes (Signed)
Subjective:    Patient ID: [name], male    DOB: 07/21/1941, 72 y.o.   MRN: [id number]  HPI PCP - Christell Constant  71/M, heavy ex smoker , quit 1992 for FU of COPD & recent pneumonia post op colectomy.  He reports increasing dyspnea for the last few years & now can barely walk a few feet. He was diagnosed with COPD several years ago. He takes lisinopril for hypertension & verapamil.  Spirometry showed severe airway obstruction- FEv1 24%.  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 Ace was discontinued. He improved post op and did not require O2 at discharge. 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 HCTZ was stopped. Given IV fluids. NA improved  Since discharge he says he is feeling better. Activity level is improving. Appetite is also improving. He has seen Dr. Zachery Dakins with staple removal  He was again hospitalised at [facility] with RLL pneumonia, given levaquin & predniosne. Dr Christell Constant continued this. He was started on oxygen, is compliant as needed & this helps. Does not want CXR today.   02/08/12  8 mnth FU Pt states's there's not much improvement in SOB or cough.  Cough is prod with white mucus. Compliant with meds, takes nebs 4 /day , also uses albuterol MDI in between & xopenex MDI - he has settled into this regimen & inspite of a long discussion about adverse effects of too much albuterol, I could not talk him out of this. He assures me that he is not using too much He is mobile at home , does not go out much Use o2 prn only CXR -persistent RLL infiltrate, also noted on CT 8/12 >>CT chest showed Persistent postinflammatory changes involving the right lower  lobe with scarring, atelectasis and peribronchial thickening. No  discrete mass or acute pneumonia. I would recommend a follow-up   noncontrast chest CT in 6 months. . Stable changes of emphysema and remote granulomatous disease   07/28/12  Follow up  He returns for six-month followup for COPD Patient states that he is doing fairly well without any increased cough or wheezing or shortness of breath and he underwent a CT chest in June of this year. That showed inflammatory changes of the right lower lobe Patient denies any fever or chest pain, orthopnea, PND, leg swelling, or weight loss. Blood pressure is elevated today. He says that he has recently been taken off his diuretic. He has been advised to follow with his family doctor for an office visit. This week regarding high blood pressure. He denies any headache, visual changes, or speech changes >CT chest set up   01/23/2013 Follow up for COPD  Returns for 6 month follow up for COPD .  Remains Advair and Spiriva .  On avg use Albuterol neb ~1 daily.  Uses MDI -Ventolin 4 x day most days-"makes me feel better"  No ER Juanita Laster. Admits since last ov.  Feels breathing is about the same, has good and bad days.  No fever, hemoptysis, edema,  Wears out easily with dyspnea. O2 sats drop on pulsing device. , discussed continuous flow with walking.    Did not go for CT chest as planned in 08/2012. Daughter says it was rescheduled but did not set up as directed.      Review  of Systems  Patient denies significant dyspnea,cough, hemoptysis,  chest pain, palpitations, pedal edema, orthopnea, paroxysmal nocturnal dyspnea, lightheadedness, nausea, vomiting, abdominal or  leg pains      Objective:   Physical Exam   Gen. Pleasant, thin frail , in no distress, normal affect ENT - no lesions, no post nasal drip Neck: No JVD, no thyromegaly, no carotid bruits Lungs: no use of accessory muscles, no dullness to percussion, decreased without rales or rhonchi  Cardiovascular: Rhythm regular, heart sounds  normal, no murmurs or gallops, no peripheral edema Abdomen: soft and  non-tender, no hepatosplenomegaly, BS normal. Musculoskeletal: No deformities, no cyanosis or clubbing Neuro:  alert, non focal       Assessment & Plan:

## 2013-01-23 NOTE — Patient Instructions (Addendum)
We need to get your  CT done .  Continue on Advair and Spiriva  Try to use Ventolin HFA less, this is a rescue inhaler to use As needed   Wear your oxygen 2 l/m continuous flow .  Follow up Dr. Vassie Loll  In 3-4  months and As needed

## 2013-01-26 NOTE — Assessment & Plan Note (Signed)
Abn CT post PNA  Did not follow up for CT as planned in 08/2012  He and daughter agreed to have this done next week.    Plan  We need to get your  CT done .   Follow up Dr. Vassie Loll  In 3-4  months and As needed

## 2013-01-26 NOTE — Assessment & Plan Note (Signed)
Compensated on present regimen   Plan    Continue on Advair and Spiriva  Try to use Ventolin HFA less, this is a rescue inhaler to use As needed   Wear your oxygen 2 l/m continuous flow .  Follow up Dr. Vassie Loll  In 3-4  months and As needed

## 2013-01-26 NOTE — Addendum Note (Signed)
Addended by: Boone Master E on: 01/26/2013 12:47 PM   Modules accepted: Orders

## 2013-01-30 ENCOUNTER — Ambulatory Visit (INDEPENDENT_AMBULATORY_CARE_PROVIDER_SITE_OTHER)
Admission: RE | Admit: 2013-01-30 | Discharge: 2013-01-30 | Disposition: A | Discharge status: Home / Self Care | Payer: Medicare Other | Source: Ambulatory Visit | Attending: Adult Health | Admitting: Adult Health

## 2013-01-30 DIAGNOSIS — J449 Chronic obstructive pulmonary disease, unspecified: Secondary | ICD-10-CM

## 2013-02-03 ENCOUNTER — Other Ambulatory Visit: Payer: Self-pay | Admitting: Adult Health

## 2013-02-03 ENCOUNTER — Encounter: Payer: Self-pay | Admitting: Adult Health

## 2013-02-03 DIAGNOSIS — J449 Chronic obstructive pulmonary disease, unspecified: Secondary | ICD-10-CM

## 2013-02-03 DIAGNOSIS — J984 Other disorders of lung: Secondary | ICD-10-CM

## 2013-05-13 ENCOUNTER — Telehealth: Payer: Self-pay | Admitting: Pulmonary Disease

## 2013-05-13 NOTE — Telephone Encounter (Signed)
left messages for pt to call back to schedule follow up apt. No return calls back. Sent letter 05/13/13 ° °

## 2013-06-04 ENCOUNTER — Ambulatory Visit: Payer: Medicare Other | Admitting: Adult Health

## 2013-07-14 ENCOUNTER — Telehealth: Payer: Self-pay | Admitting: Pulmonary Disease

## 2013-07-14 NOTE — Telephone Encounter (Signed)
Auth# put in pt's appt for billing to see Tobe Sos

## 2013-08-10 ENCOUNTER — Other Ambulatory Visit: Payer: Medicare Other

## 2013-08-14 ENCOUNTER — Ambulatory Visit (INDEPENDENT_AMBULATORY_CARE_PROVIDER_SITE_OTHER)
Admission: RE | Admit: 2013-08-14 | Discharge: 2013-08-14 | Disposition: A | Discharge status: Home / Self Care | Payer: Medicare Other | Source: Ambulatory Visit | Attending: Adult Health | Admitting: Adult Health

## 2013-08-14 DIAGNOSIS — J449 Chronic obstructive pulmonary disease, unspecified: Secondary | ICD-10-CM

## 2013-08-18 ENCOUNTER — Encounter: Payer: Self-pay | Admitting: Adult Health

## 2013-08-21 NOTE — Progress Notes (Signed)
Quick Note:  Pt scheduled to see RA 12.15.14 in the [facility] office to discuss results in detail ______

## 2013-08-24 ENCOUNTER — Encounter: Payer: Self-pay | Admitting: Pulmonary Disease

## 2013-08-24 ENCOUNTER — Ambulatory Visit (INDEPENDENT_AMBULATORY_CARE_PROVIDER_SITE_OTHER): Payer: Medicare Other | Admitting: Pulmonary Disease

## 2013-08-24 VITALS — BP 142/78 | HR 82 | Ht 61.0 in | Wt 115.5 lb

## 2013-08-24 DIAGNOSIS — R911 Solitary pulmonary nodule: Secondary | ICD-10-CM

## 2013-08-24 DIAGNOSIS — J449 Chronic obstructive pulmonary disease, unspecified: Secondary | ICD-10-CM

## 2013-08-24 NOTE — Progress Notes (Signed)
   Subjective:    Patient ID: [name], male    DOB: 1941-05-07, 72 y.o.   MRN: [id number]  HPI  PCP - Christell Constant   72/M, heavy ex smoker , quit 1992 for FU of COPD -[facility] C Spirometry showed severe airway obstruction- FEv1 24%.  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 He was again hospitalised at [facility] with RLL pneumonia -required O2 since      08/24/2013  Chief Complaint  Patient presents with  . Follow-up    Pt reports he has good and bad days with his breathing, occas cough w/ off white color phlem, wheezing and chest tx w/ exertion. He uses 2 liters O2 24/7.    Compliant with meds, takes 1 ml nebs 4 /day (breaks one dose into 4 now), also uses albuterol MDI in between & xopenex MDI -after prior discussions about adverse effects of too much albuterol He is mobile at home , does not go out much  Use o2    Remains Advair and Spiriva .  On avg use Albuterol neb ~1 daily.  Uses MDI -Ventolin 4 x day most days--works better than xopenex  CT shows scarring in RLL and bronchiectasis  New  -spiculated scar in RUL in dec 2014 compared to 5/14 Denies swallowing issues Past Medical History  Diagnosis Date  . HTN (hypertension)   . Asthma   . 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 COPD (chronic obstructive pulmonary disease)   . Tumor cells, malignant     intestinal  . Pneumonia 05/10/11    hospitalized    Review of Systems neg for any significant sore throat, dysphagia, itching, sneezing, nasal congestion or excess/ purulent secretions, fever, chills, sweats, unintended wt loss, pleuritic or exertional cp, hempoptysis, orthopnea pnd or change in chronic leg swelling. Also denies presyncope, palpitations, heartburn, abdominal pain, nausea, vomiting, diarrhea or change in bowel or  urinary habits, dysuria,hematuria, rash, arthralgias, visual complaints, headache, numbness weakness or ataxia.     Objective:   Physical Exam  Gen. Pleasant, well-nourished, in no distress ENT - no lesions, no post nasal drip Neck: No JVD, no thyromegaly, no carotid bruits Lungs: no use of accessory muscles, no dullness to percussion, clear without rales or rhonchi  Cardiovascular: Rhythm regular, heart sounds  normal, no murmurs or gallops, no peripheral edema Musculoskeletal: No deformities, no cyanosis or clubbing        Assessment & Plan:

## 2013-08-24 NOTE — Patient Instructions (Signed)
Stay on advair & spiriva OK to take albuterol spray instead of xopenex CT chest in 6 months

## 2013-08-24 NOTE — Assessment & Plan Note (Signed)
Stay on advair & spiriva OK to take albuterol spray instead of xopenex

## 2013-08-24 NOTE — Assessment & Plan Note (Signed)
Rpt CT chest in 6 months, high risk for malignancy but inflammatory etiology possible given acute appearance

## 2013-12-17 ENCOUNTER — Ambulatory Visit (INDEPENDENT_AMBULATORY_CARE_PROVIDER_SITE_OTHER): Payer: Medicare HMO | Admitting: Adult Health

## 2013-12-17 ENCOUNTER — Encounter: Payer: Self-pay | Admitting: Adult Health

## 2013-12-17 VITALS — BP 116/74 | HR 83 | Temp 98.3°F | Wt 115.6 lb

## 2013-12-17 DIAGNOSIS — J449 Chronic obstructive pulmonary disease, unspecified: Secondary | ICD-10-CM

## 2013-12-17 DIAGNOSIS — R911 Solitary pulmonary nodule: Secondary | ICD-10-CM

## 2013-12-17 MED ORDER — PREDNISONE 10 MG PO TABS: ORAL_TABLET | ORAL | Status: DC

## 2013-12-17 NOTE — Patient Instructions (Addendum)
Continue on Advair and Spiriva  Prednisone taper over next week .  Wear your oxygen 2 l/m continuous flow .  Follow up Dr. Elsworth Soho  In  2 months and As needed   CT in June  Please contact office for sooner follow up if symptoms do not improve or worsen or seek emergency care

## 2013-12-17 NOTE — Progress Notes (Signed)
   Subjective:    Patient ID: [name], male    DOB: Dec 24, 1940, 73 y.o.   MRN: [id number]  HPI PCP - Laurance Flatten   72/M, heavy ex smoker , quit 1992 for FU of COPD -gold C Spirometry showed severe airway obstruction- FEv1 24%.  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 He was again hospitalised at [facility] with RLL pneumonia -required O2 since  compliant with meds, takes 1 ml nebs 4 /day (breaks one dose into 4 now), also uses albuterol MDI in between & xopenex MDI -after prior discussions about adverse effects of too much albuterol He is mobile at home , does not go out much  Use o2    Remains Advair and Spiriva .  On avg use Albuterol neb ~1 daily.  Uses MDI -Ventolin 4 x day most days--works better than xopenex  CT shows scarring in RLL and bronchiectasis  New  -spiculated scar in RUL in dec 2014 compared to 5/14 Denies swallowing issues  12/17/2013 Follow up  Complains of increased SOB, prod cough with clear mucus, wheezing, chest tightness x6 weeks . Breathing worse for last 1 week.  Denies any f/c/s, nausea, vomiting, hemoptysis or discolored mucus.  Sats drop on pulsing/demand setting O2 device, discussed continous flow only.  Due for CT to follow nodule in June. No wt loss or hemotpysis     Past Medical History  Diagnosis Date  . HTN (hypertension)   . Asthma   . 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 COPD (chronic obstructive pulmonary disease)   . Tumor cells, malignant     intestinal  . Pneumonia 05/10/11    hospitalized    Review of Systems  neg for any significant sore throat, dysphagia, itching, sneezing, nasal congestion or excess/ purulent secretions, fever, chills, sweats, unintended wt loss, pleuritic or exertional cp, hempoptysis, orthopnea pnd or change in chronic  leg swelling. Also denies presyncope, palpitations, heartburn, abdominal pain, nausea, vomiting, diarrhea or change in bowel or urinary habits, dysuria,hematuria, rash, arthralgias, visual complaints, headache, numbness weakness or ataxia.     Objective:   Physical Exam   Gen. Pleasant, elderly  in no distress ENT - no lesions, no post nasal drip Neck: No JVD, no thyromegaly, no carotid bruits Lungs: no use of accessory muscles, no dullness to percussion, faint exp wheeze  Cardiovascular: Rhythm regular, heart sounds  normal, no murmurs or gallops, no peripheral edema Musculoskeletal: No deformities, no cyanosis or clubbing        Assessment & Plan:

## 2013-12-18 NOTE — Assessment & Plan Note (Signed)
Mild flare   Plan Continue on Advair and Spiriva  Prednisone taper over next week .  Wear your oxygen 2 l/m continuous flow .  Follow up Dr. Elsworth Soho  In  2 months and As needed   CT in June  Please contact office for sooner follow up if symptoms do not improve or worsen or seek emergency care

## 2013-12-18 NOTE — Assessment & Plan Note (Signed)
follow up CT in June 2015 , ov after to discuss results.

## 2013-12-20 NOTE — Progress Notes (Signed)
Reviewed & agree with plan  

## 2014-01-04 ENCOUNTER — Other Ambulatory Visit: Payer: Medicare HMO

## 2014-01-12 ENCOUNTER — Other Ambulatory Visit (HOSPITAL_BASED_OUTPATIENT_CLINIC_OR_DEPARTMENT_OTHER): Payer: Medicare HMO

## 2014-02-05 ENCOUNTER — Emergency Department (HOSPITAL_COMMUNITY): Payer: Medicare HMO

## 2014-02-05 ENCOUNTER — Encounter (HOSPITAL_COMMUNITY): Payer: Self-pay | Admitting: Emergency Medicine

## 2014-02-05 ENCOUNTER — Inpatient Hospital Stay (HOSPITAL_COMMUNITY)
Admission: EM | Admit: 2014-02-05 | Discharge: 2014-02-15 | DRG: 190 | Disposition: A | Discharge status: Hospice | Payer: Medicare HMO | Attending: Internal Medicine | Admitting: Internal Medicine

## 2014-02-05 DIAGNOSIS — J441 Chronic obstructive pulmonary disease with (acute) exacerbation: Principal | ICD-10-CM | POA: Diagnosis present

## 2014-02-05 DIAGNOSIS — Z87891 Personal history of nicotine dependence: Secondary | ICD-10-CM

## 2014-02-05 DIAGNOSIS — J45901 Unspecified asthma with (acute) exacerbation: Principal | ICD-10-CM

## 2014-02-05 DIAGNOSIS — I10 Essential (primary) hypertension: Secondary | ICD-10-CM | POA: Diagnosis present

## 2014-02-05 DIAGNOSIS — E43 Unspecified severe protein-calorie malnutrition: Secondary | ICD-10-CM | POA: Diagnosis present

## 2014-02-05 DIAGNOSIS — Z9981 Dependence on supplemental oxygen: Secondary | ICD-10-CM

## 2014-02-05 DIAGNOSIS — R911 Solitary pulmonary nodule: Secondary | ICD-10-CM

## 2014-02-05 DIAGNOSIS — Z79899 Other long term (current) drug therapy: Secondary | ICD-10-CM

## 2014-02-05 DIAGNOSIS — J9600 Acute respiratory failure, unspecified whether with hypoxia or hypercapnia: Secondary | ICD-10-CM | POA: Diagnosis present

## 2014-02-05 DIAGNOSIS — R0603 Acute respiratory distress: Secondary | ICD-10-CM

## 2014-02-05 DIAGNOSIS — Z66 Do not resuscitate: Secondary | ICD-10-CM | POA: Diagnosis not present

## 2014-02-05 DIAGNOSIS — J9620 Acute and chronic respiratory failure, unspecified whether with hypoxia or hypercapnia: Secondary | ICD-10-CM | POA: Diagnosis present

## 2014-02-05 DIAGNOSIS — F1010 Alcohol abuse, uncomplicated: Secondary | ICD-10-CM | POA: Diagnosis present

## 2014-02-05 DIAGNOSIS — Z515 Encounter for palliative care: Secondary | ICD-10-CM

## 2014-02-05 DIAGNOSIS — E871 Hypo-osmolality and hyponatremia: Secondary | ICD-10-CM | POA: Diagnosis present

## 2014-02-05 DIAGNOSIS — B370 Candidal stomatitis: Secondary | ICD-10-CM | POA: Diagnosis present

## 2014-02-05 DIAGNOSIS — Z85820 Personal history of malignant melanoma of skin: Secondary | ICD-10-CM

## 2014-02-05 DIAGNOSIS — F411 Generalized anxiety disorder: Secondary | ICD-10-CM | POA: Diagnosis present

## 2014-02-05 DIAGNOSIS — J449 Chronic obstructive pulmonary disease, unspecified: Secondary | ICD-10-CM

## 2014-02-05 DIAGNOSIS — IMO0002 Reserved for concepts with insufficient information to code with codable children: Secondary | ICD-10-CM

## 2014-02-05 DIAGNOSIS — J9602 Acute respiratory failure with hypercapnia: Secondary | ICD-10-CM

## 2014-02-05 DIAGNOSIS — C189 Malignant neoplasm of colon, unspecified: Secondary | ICD-10-CM

## 2014-02-05 MED ORDER — ALBUTEROL (5 MG/ML) CONTINUOUS INHALATION SOLN
10.0000 mg/h | INHALATION_SOLUTION | Freq: Once | RESPIRATORY_TRACT | Status: AC
  Administered 2014-02-05: 10 mg/h via RESPIRATORY_TRACT
  Filled 2014-02-05: qty 20

## 2014-02-05 NOTE — ED Notes (Signed)
Patient's daughter called and said he was having a Asthma attack.  She also said that for about four weeks he has been having breathing problems.  So he finally told her to call EMS.  Patient was given solumedrol and a duo-neb and transported here to the ED.

## 2014-02-06 DIAGNOSIS — J9600 Acute respiratory failure, unspecified whether with hypoxia or hypercapnia: Secondary | ICD-10-CM

## 2014-02-06 DIAGNOSIS — J441 Chronic obstructive pulmonary disease with (acute) exacerbation: Secondary | ICD-10-CM

## 2014-02-06 DIAGNOSIS — R0989 Other specified symptoms and signs involving the circulatory and respiratory systems: Secondary | ICD-10-CM

## 2014-02-06 DIAGNOSIS — R0609 Other forms of dyspnea: Secondary | ICD-10-CM

## 2014-02-06 DIAGNOSIS — E871 Hypo-osmolality and hyponatremia: Secondary | ICD-10-CM

## 2014-02-06 LAB — BASIC METABOLIC PANEL
BUN: 8 mg/dL (ref 6–23)
BUN: 9 mg/dL (ref 6–23)
BUN: 5 mg/dL — AB (ref 6–23)
CHLORIDE: 83 meq/L — AB (ref 96–112)
CO2: 26 meq/L (ref 19–32)
CO2: 27 mEq/L (ref 19–32)
CO2: 29 meq/L (ref 19–32)
CREATININE: 0.63 mg/dL (ref 0.50–1.35)
CREATININE: 0.66 mg/dL (ref 0.50–1.35)
CREATININE: 0.44 mg/dL — AB (ref 0.50–1.35)
Calcium: 9 mg/dL (ref 8.4–10.5)
Calcium: 9 mg/dL (ref 8.4–10.5)
Calcium: 9.2 mg/dL (ref 8.4–10.5)
Chloride: 84 mEq/L — ABNORMAL LOW (ref 96–112)
Chloride: 85 mEq/L — ABNORMAL LOW (ref 96–112)
GFR calc Af Amer: >90 mL/min (ref >90)
GFR calc Af Amer: >90 mL/min (ref >90)
GFR calc non Af Amer: >90 mL/min (ref >90)
GFR calc non Af Amer: >90 mL/min (ref >90)
GLUCOSE: 150 mg/dL — AB (ref 70–99)
Glucose, Bld: 124 mg/dL — ABNORMAL HIGH (ref 70–99)
Glucose, Bld: 141 mg/dL — ABNORMAL HIGH (ref 70–99)
Potassium: 4.1 mEq/L (ref 3.7–5.3)
Potassium: 4.3 mEq/L (ref 3.7–5.3)
Potassium: 5.2 mEq/L (ref 3.7–5.3)
Sodium: 125 mEq/L — ABNORMAL LOW (ref 137–147)
Sodium: 126 mEq/L — ABNORMAL LOW (ref 137–147)
Sodium: 127 mEq/L — ABNORMAL LOW (ref 137–147)

## 2014-02-06 LAB — CBC WITH DIFFERENTIAL/PLATELET
BASOS ABS: 0 10*3/uL (ref 0.0–0.1)
BASOS PCT: 0 % (ref 0–1)
EOS ABS: 0.4 10*3/uL (ref 0.0–0.7)
Eosinophils Relative: 7 % — ABNORMAL HIGH (ref 0–5)
HCT: 44.3 % (ref 39.0–52.0)
HEMOGLOBIN: 15.4 g/dL (ref 13.0–17.0)
Lymphocytes Relative: 19 % (ref 12–46)
Lymphs Abs: 1 10*3/uL (ref 0.7–4.0)
MCH: 32.6 pg (ref 26.0–34.0)
MCHC: 34.8 g/dL (ref 30.0–36.0)
MCV: 93.7 fL (ref 78.0–100.0)
MONO ABS: 0.6 10*3/uL (ref 0.1–1.0)
MONOS PCT: 11 % (ref 3–12)
NEUTROS PCT: 63 % (ref 43–77)
Neutro Abs: 3.4 10*3/uL (ref 1.7–7.7)
Platelets: 186 10*3/uL (ref 150–400)
RBC: 4.73 MIL/uL (ref 4.22–5.81)
RDW: 12.5 % (ref 11.5–15.5)
WBC: 5.3 10*3/uL (ref 4.0–10.5)

## 2014-02-06 LAB — URINALYSIS, ROUTINE W REFLEX MICROSCOPIC
Glucose, UA: NEGATIVE mg/dL
Hgb urine dipstick: NEGATIVE
Ketones, ur: 15 mg/dL — AB
LEUKOCYTES UA: NEGATIVE
NITRITE: NEGATIVE
PH: 5.5 (ref 5.0–8.0)
Protein, ur: 100 mg/dL — AB
SPECIFIC GRAVITY, URINE: 1.021 (ref 1.005–1.030)
Urobilinogen, UA: 1 mg/dL (ref 0.0–1.0)

## 2014-02-06 LAB — URINE MICROSCOPIC-ADD ON

## 2014-02-06 LAB — CBC
HEMATOCRIT: 41.4 % (ref 39.0–52.0)
HEMOGLOBIN: 14.7 g/dL (ref 13.0–17.0)
MCH: 33.1 pg (ref 26.0–34.0)
MCHC: 35.5 g/dL (ref 30.0–36.0)
MCV: 93.2 fL (ref 78.0–100.0)
Platelets: 182 10*3/uL (ref 150–400)
RBC: 4.44 MIL/uL (ref 4.22–5.81)
RDW: 12.7 % (ref 11.5–15.5)
WBC: 11.1 10*3/uL — ABNORMAL HIGH (ref 4.0–10.5)

## 2014-02-06 LAB — I-STAT ARTERIAL BLOOD GAS, ED
ACID-BASE EXCESS: 5 mmol/L — AB (ref 0.0–2.0)
BICARBONATE: 30.8 meq/L — AB (ref 20.0–24.0)
O2 SAT: 94 %
PCO2 ART: 47.6 mmHg — AB (ref 35.0–45.0)
Patient temperature: 98.6
TCO2: 32 mmol/L (ref 0–100)
pH, Arterial: 7.418 (ref 7.350–7.450)
pO2, Arterial: 71 mmHg — ABNORMAL LOW (ref 80.0–100.0)

## 2014-02-06 LAB — MRSA PCR SCREENING: MRSA BY PCR: NEGATIVE

## 2014-02-06 LAB — TROPONIN I

## 2014-02-06 MED ORDER — LORAZEPAM 2 MG/ML IJ SOLN
1.0000 mg | Freq: Once | INTRAMUSCULAR | Status: AC
  Administered 2014-02-06: 1 mg via INTRAVENOUS

## 2014-02-06 MED ORDER — METHYLPREDNISOLONE SODIUM SUCC 125 MG IJ SOLR
60.0000 mg | Freq: Two times a day (BID) | INTRAMUSCULAR | Status: DC
  Administered 2014-02-06: 60 mg via INTRAVENOUS
  Filled 2014-02-06: qty 0.96
  Filled 2014-02-06: qty 2
  Filled 2014-02-06: qty 0.96

## 2014-02-06 MED ORDER — IPRATROPIUM-ALBUTEROL 0.5-2.5 (3) MG/3ML IN SOLN
3.0000 mL | RESPIRATORY_TRACT | Status: DC
  Administered 2014-02-06 – 2014-02-15 (×55): 3 mL via RESPIRATORY_TRACT
  Filled 2014-02-06 (×54): qty 3

## 2014-02-06 MED ORDER — HEPARIN SODIUM (PORCINE) 5000 UNIT/ML IJ SOLN
5000.0000 [IU] | Freq: Three times a day (TID) | INTRAMUSCULAR | Status: DC
  Administered 2014-02-06 – 2014-02-12 (×18): 5000 [IU] via SUBCUTANEOUS
  Filled 2014-02-06 (×22): qty 1

## 2014-02-06 MED ORDER — LORAZEPAM 2 MG/ML IJ SOLN
INTRAMUSCULAR | Status: AC
  Filled 2014-02-06: qty 1

## 2014-02-06 MED ORDER — LEVOFLOXACIN IN D5W 500 MG/100ML IV SOLN
500.0000 mg | Freq: Every day | INTRAVENOUS | Status: DC
  Administered 2014-02-06 – 2014-02-07 (×3): 500 mg via INTRAVENOUS
  Filled 2014-02-06 (×4): qty 100

## 2014-02-06 MED ORDER — CHLORHEXIDINE GLUCONATE 0.12 % MT SOLN
15.0000 mL | Freq: Two times a day (BID) | OROMUCOSAL | Status: DC
  Administered 2014-02-06 – 2014-02-07 (×3): 15 mL via OROMUCOSAL
  Filled 2014-02-06 (×5): qty 15

## 2014-02-06 MED ORDER — SODIUM CHLORIDE 0.9 % IV SOLN: 250.0000 mL | INTRAVENOUS | Status: DC | PRN

## 2014-02-06 MED ORDER — METHYLPREDNISOLONE SODIUM SUCC 125 MG IJ SOLR
80.0000 mg | Freq: Three times a day (TID) | INTRAMUSCULAR | Status: DC
  Administered 2014-02-06 – 2014-02-07 (×3): 80 mg via INTRAVENOUS
  Filled 2014-02-06 (×6): qty 1.28

## 2014-02-06 MED ORDER — FENTANYL 25 MCG/HR TD PT72
25.0000 ug | MEDICATED_PATCH | TRANSDERMAL | Status: DC
  Administered 2014-02-06 – 2014-02-09 (×2): 25 ug via TRANSDERMAL
  Filled 2014-02-06 (×2): qty 1

## 2014-02-06 MED ORDER — PANTOPRAZOLE SODIUM 40 MG PO TBEC
40.0000 mg | DELAYED_RELEASE_TABLET | Freq: Two times a day (BID) | ORAL | Status: DC
Start: 2014-02-06 — End: 2014-02-06

## 2014-02-06 MED ORDER — ALBUTEROL SULFATE (2.5 MG/3ML) 0.083% IN NEBU
2.5000 mg | INHALATION_SOLUTION | RESPIRATORY_TRACT | Status: DC | PRN
  Administered 2014-02-09: 2.5 mg via RESPIRATORY_TRACT
  Filled 2014-02-06: qty 3

## 2014-02-06 MED ORDER — PANTOPRAZOLE SODIUM 40 MG IV SOLR
40.0000 mg | Freq: Two times a day (BID) | INTRAVENOUS | Status: DC
  Administered 2014-02-06: 40 mg via INTRAVENOUS
  Filled 2014-02-06 (×3): qty 40

## 2014-02-06 MED ORDER — BIOTENE DRY MOUTH MT LIQD
15.0000 mL | Freq: Two times a day (BID) | OROMUCOSAL | Status: DC
  Administered 2014-02-06 – 2014-02-15 (×15): 15 mL via OROMUCOSAL

## 2014-02-06 NOTE — H&P (Signed)
PULMONARY / CRITICAL CARE MEDICINE   Name: [name] MRN: [id number] DOB: 1940-11-29    ADMISSION DATE:  02/05/2014  PRIMARY SERVICE: PCCM  CHIEF COMPLAINT:   COPD exacerbation  BRIEF PATIENT DESCRIPTION:  73 years old male with PMH relevant for HTN, and COPD with FEV1 of 26% in 2012. Presents with 3 days history of progressive SOB. At arrival to the ED found to be in respiratory distress and hypoxemic as low as 83% on RA. Started on BiPAP with significant improvement.   SIGNIFICANT EVENTS / STUDIES:  - Chest X ray showed sever hyperinflation but no parenchymal infiltrates.  LINES / TUBES: - Peripheral IV's  CULTURES: - Sputum culture ordered.   ANTIBIOTICS: - Levaquin  HISTORY OF PRESENT ILLNESS:   73 years old male with PMH relevant for HTN, and COPD with FEV1 of 26% in 2012. He is a patient of Dr. Elsworth Soho and on Advair and Spiriva at home. History of non compliance.  Presents with 3 days history of progressive SOB. At arrival to the ED found to be in respiratory distress and hypoxemic as low as 83% on RA. Denies increased cough, sputum production, fever or chills. In the ED he received IV steroids, duonebs and was started on BiPAP with significant improvement. Chest X ray showed severe hyperinflation but no parenchymal infiltrates. ABG on BiPAP 5/5 and FiO2 40% showed 7.41/47/71/30/94. PCCM called to admit  PAST MEDICAL HISTORY :  Past Medical History  Diagnosis Date  . HTN (hypertension)   . Asthma   . 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 COPD (chronic obstructive pulmonary disease)   . Tumor cells, malignant     intestinal  . Pneumonia 05/10/11    hospitalized   Past Surgical History  Procedure Laterality Date  . Hemorrhoid surgery    . Large intestinal surgery  02/16/2011    cancer- right colectomy   Prior to Admission medications   Medication Sig Start Date End Date Taking? Authorizing Provider  albuterol (PROVENTIL) (2.5 MG/3ML) 0.083% nebulizer  solution Take 2.5 mg by nebulization every 4 (four) hours as needed.      Historical Provider, MD  albuterol (VENTOLIN HFA) 108 (90 BASE) MCG/ACT inhaler Inhale 2 puffs into the lungs every 4 (four) hours as needed.    Historical Provider, MD  Fluticasone-Salmeterol (ADVAIR DISKUS) 250-50 MCG/DOSE AEPB Inhale 1 puff into the lungs every 12 (twelve) hours.      Historical Provider, MD  LORazepam (ATIVAN) 0.5 MG tablet Take 0.5 mg by mouth every 8 (eight) hours as needed.     Historical Provider, MD  Multiple Vitamin (MULTIVITAMIN) capsule Take 1 capsule by mouth daily.      Historical Provider, MD  naproxen sodium (ANAPROX) 220 MG tablet Take 220 mg by mouth as needed.    Historical Provider, MD  predniSONE (DELTASONE) 10 MG tablet 4 tabs for 2 days, then 3 tabs for 2 days, 2 tabs for 2 days, then 1 tab for 2 days, then stop 12/17/13   Tammy S Parrett, NP  Tamsulosin HCl (FLOMAX) 0.4 MG CAPS Take 0.4 mg by mouth daily.     Historical Provider, MD  tiotropium (SPIRIVA) 18 MCG inhalation capsule Place 18 mcg into inhaler and inhale daily. 03/21/11 08/24/13  Rigoberto Noel, MD  verapamil (CALAN) 120 MG tablet Take 240 mg by mouth daily. Extended release    Historical Provider, MD   No Known Allergies  FAMILY HISTORY:  Family History  Problem Relation  Age of Onset  . Other      adopted   SOCIAL HISTORY:  reports that he quit smoking about 23 years ago. His smoking use included Cigarettes. He has a 60 pack-year smoking history. He quit smokeless tobacco use about 2 years ago. His smokeless tobacco use included Snuff. He reports that he drinks alcohol. He reports that he does not use illicit drugs.  REVIEW OF SYSTEMS:   All systems reviewed and found negative except for what I mentioned in the HPI.   SUBJECTIVE:   VITAL SIGNS: Pulse Rate:  [91-98] 94 (05/30 0300) Resp:  [18-24] 19 (05/30 0300) BP: (97-182)/(63-94) 97/63 mmHg (05/30 0200) SpO2:  [92 %-100 %] 92 % (05/30 0300) FiO2 (%):  [40 %]  40 % (05/30 0355) HEMODYNAMICS:   VENTILATOR SETTINGS: Vent Mode:  [-] BIPAP FiO2 (%):  [40 %] 40 % Set Rate:  [15 bmp] 15 bmp INTAKE / OUTPUT: Intake/Output   None     PHYSICAL EXAMINATION: General: Pleasant male patient in mild respiratory distress. Eyes: Anicteric sclerae. ENT: Oropharynx clear. Dry mucous membranes. No thrush Lymph: No cervical, supraclavicular, or axillary lymphadenopathy. Heart: Normal S1, S2. No murmurs, rubs, or gallops appreciated. No bruits, equal pulses. Lungs: Diminished breath sounds bilaterally, without wheezes or crackles. Normal upper airway sounds without evidence of stridor. Abdomen: Abdomen soft, non-tender and not distended, normoactive bowel sounds. No hepatosplenomegaly or masses. Musculoskeletal: No clubbing or synovitis. No edema Skin: No rashes or lesions Neuro: No focal neurologic deficits.   LABS:  CBC  Recent Labs Lab 02/05/14 2340  WBC 5.3  HGB 15.4  HCT 44.3  PLT 186   Coag's No results found for this basename: APTT, INR,  in the last 168 hours BMET  Recent Labs Lab 02/05/14 2340  NA 125*  K 4.1  CL 85*  CO2 27  BUN 5*  CREATININE 0.44*  GLUCOSE 124*   Electrolytes  Recent Labs Lab 02/05/14 2340  CALCIUM 9.0   Sepsis Markers No results found for this basename: LATICACIDVEN, PROCALCITON, O2SATVEN,  in the last 168 hours ABG  Recent Labs Lab 02/06/14 0331  PHART 7.418  PCO2ART 47.6*  PO2ART 71.0*   Liver Enzymes No results found for this basename: AST, ALT, ALKPHOS, BILITOT, ALBUMIN,  in the last 168 hours Cardiac Enzymes  Recent Labs Lab 02/05/14 2340  TROPONINI <0.30   Glucose No results found for this basename: GLUCAP,  in the last 168 hours  Imaging Dg Chest Port 1 View  02/06/2014   CLINICAL DATA:  Shortness of breath.  COPD.  EXAM: PORTABLE CHEST - 1 VIEW  COMPARISON:  08/14/2013 chest CT  FINDINGS: Chronic, severe pulmonary hyperinflation and apical emphysematous change. There are  remote/calcified bilateral pulmonary granulomas. No evidence of air leak, consolidation, edema, or effusion. Normal heart size and upper mediastinal contours when accounting for distortion by rightward rotation. Gas overlapping the lower mediastinum is anterior and subdiaphragmatic based on previous CT.  IMPRESSION: Severe emphysema.  No acute superimposed findings.   Electronically Signed   By: Jorje Guild M.D.   On: 02/06/2014 00:23     ASSESSMENT / PLAN:  PULMONARY A: 1) Acute hypoxemic respiratory failure 2) COPD exacerbation 3) FEV1 of 26% on spirometry from 2012 P:   - Will admit to stepdown - Improving on BiPAP, 40% FiO2 - Solumedrol 60 mg IV BID - Levaquin  - RVP, sputum culture - Duoneb q 4 hrs - Albuterol PRN  CARDIOVASCULAR A:  1) Hemodynamically stable P:  -  ICU monitoring  RENAL A:   1) Hyponatremia, dehydrated on exam 2) Normal creatinine P:   - Gentle hydration with NS at 100 cc/hr for 1 L - Will follow BMP at 8:00 am  GASTROINTESTINAL A:   1) No issues P:    HEMATOLOGIC A:  1) No issues P:   INFECTIOUS A:   1) COPD exacerbation. No clear infiltrates on chest X ray. P:   - Levaquin IV  ENDOCRINE A:   1) No issues P:    NEUROLOGIC A:   1) No issues P:     I have personally obtained a history, examined the patient, evaluated laboratory and imaging results, formulated the assessment and plan and placed orders. CRITICAL CARE: Critical Care Time devoted to patient care services described in this note is 45 minutes.   Waynetta Pean, MD Pulmonary and [place] Pager: [phone]  02/06/2014, 3:58 AM

## 2014-02-06 NOTE — Progress Notes (Signed)
Pt admitted from ED on BiPAP. He is now refusing BiPAP. MD notified. ELINK camera turned on and MD strongly encouraged patient to wear BiPAP. Will continue to monitor.

## 2014-02-06 NOTE — Progress Notes (Signed)
INITIAL NUTRITION ASSESSMENT  DOCUMENTATION CODES Per approved criteria  -Not applicable    INTERVENTION: - Diet advancement per MD - Recommend Boost Plus BID when diet advanced - RD to monitor   NUTRITION DIAGNOSIS: Inadequate oral intake related to inability to eat as evidenced by NPO.   Goal: Advance diet as tolerated to regular diet  Monitor:  Weights, labs, diet advancement  Reason for Assessment: Malnutrition screening tool   73 y.o. male  Admitting Dx: COPD exacerbation  ASSESSMENT: Pt with PMH relevant for HTN, and COPD. Presents with 3 days history of progressive SOB. At arrival to the ED found to be in respiratory distress and hypoxemic as low as 83% on room. Started on BiPAP with significant improvement.   -Pt alone in room on Bi-PAP -Reports his appetite and intake have been down for the past 1-2 months -When asked about his meal intake he said "it's hard to say", eats whatever his friends and family bring him -States his usual weight is between 111-115 pounds -Currently NPO but agreeable to getting Boost Plus when diet advanced, does not like Ensure Complete    Height: Ht Readings from Last 1 Encounters:  02/06/14 5\' 1"  (1.549 m)    Weight: Wt Readings from Last 1 Encounters:  02/06/14 111 lb 12.4 oz (50.7 kg)    Ideal Body Weight: 112 lbs  % Ideal Body Weight: 99%  Wt Readings from Last 10 Encounters:  02/06/14 111 lb 12.4 oz (50.7 kg)  12/17/13 115 lb 9.6 oz (52.436 kg)  08/24/13 115 lb 8 oz (52.39 kg)  01/23/13 107 lb 12.8 oz (48.898 kg)  07/28/12 114 lb 12.8 oz (52.073 kg)  02/08/12 116 lb 9.6 oz (52.889 kg)  05/24/11 122 lb 3.2 oz (55.43 kg)  05/18/11 116 lb 4 oz (52.731 kg)  04/26/11 124 lb 3.2 oz (56.337 kg)  03/13/11 124 lb (56.246 kg)    Usual Body Weight: 111-115 lbs per pt  % Usual Body Weight: 100%  BMI:  Body mass index is 21.13 kg/(m^2).  Estimated Nutritional Needs: Kcal: 1300-1500 Protein: 65-85g Fluid:  1.3-1.5L/day  Skin: Stage 1 sacral pressure ulcer   Diet Order: NPO  EDUCATION NEEDS: -No education needs identified at this time   Intake/Output Summary (Last 24 hours) at 02/06/14 1551 Last data filed at 02/06/14 0930  Gross per 24 hour  Intake      0 ml  Output    200 ml  Net   -200 ml    Last BM: 5/28  Labs:   Recent Labs Lab 02/05/14 2340 02/06/14 0457 02/06/14 0723  NA 125* 127* 126*  K 4.1 4.3 5.2  CL 85* 84* 83*  CO2 27 26 29   BUN 5* 8 9  CREATININE 0.44* 0.63 0.66  CALCIUM 9.0 9.2 9.0  GLUCOSE 124* 150* 141*    CBG (last 3)  No results found for this basename: GLUCAP,  in the last 72 hours  Scheduled Meds: . antiseptic oral rinse  15 mL Mouth Rinse q12n4p  . chlorhexidine  15 mL Mouth Rinse BID  . fentaNYL  25 mcg Transdermal Q72H  . heparin  5,000 Units Subcutaneous 3 times per day  . ipratropium-albuterol  3 mL Nebulization Q4H  . levofloxacin (LEVAQUIN) IV  500 mg Intravenous QHS  . methylPREDNISolone (SOLU-MEDROL) injection  80 mg Intravenous 3 times per day  . pantoprazole (PROTONIX) IV  40 mg Intravenous Q12H    Continuous Infusions:   Past Medical History  Diagnosis Date  .  HTN (hypertension)   . Asthma   . 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 COPD (chronic obstructive pulmonary disease)   . Tumor cells, malignant     intestinal  . Pneumonia 05/10/11    hospitalized    Past Surgical History  Procedure Laterality Date  . Hemorrhoid surgery    . Large intestinal surgery  02/16/2011    cancer- right colectomy    Mikey College MS, RD, LDN [phone] Weekend/After Hours Pager

## 2014-02-06 NOTE — Progress Notes (Signed)
Transported patient on BIPAP from ED to 3S-09 without any complications.  Patient refused to wear BIPAP any longer and was placed on 5LNC.  RN to call MD. RT will continue to monitor.

## 2014-02-06 NOTE — Progress Notes (Signed)
Dr. Melvyn Novas notified that [name] reports drinking 8-12 beers/day. States he doesn't use any other form of alcohol. [name] not agitated at this time. No new orders received. Kirby Crigler [employer]

## 2014-02-06 NOTE — ED Provider Notes (Signed)
CSN: [id number]     Arrival date & time 02/05/14  2319 History   First MD Initiated Contact with Patient 02/05/14 2323     Chief Complaint  Patient presents with  . Respiratory Distress    Patient's daughter called and said he was having a Asthma attack.  She also said that for about four weeks he has been having breathing problems.  So he finally told her to call EMS.     (Consider location/radiation/quality/duration/timing/severity/associated sxs/prior Treatment) HPI 73 year old male presents to emergency department from home via EMS with respiratory distress.  Patient has COPD, asthma.  Patient has limited input secondary to respiratory distress.  Per EMS, patient has had worsening shortness of breath over last month.  They report that he has been noncompliant with his medications.  Patient denies any fever.  He has had cough.  Patient has received Solu-Medrol albuterol and duo neb in route. Past Medical History  Diagnosis Date  . HTN (hypertension)   . Asthma   . 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 COPD (chronic obstructive pulmonary disease)   . Tumor cells, malignant     intestinal  . Pneumonia 05/10/11    hospitalized   Past Surgical History  Procedure Laterality Date  . Hemorrhoid surgery    . Large intestinal surgery  02/16/2011    cancer- right colectomy   Family History  Problem Relation Age of Onset  . Other      adopted   History  Substance Use Topics  . Smoking status: Former Smoker -- 2.00 packs/day for 30 years    Types: Cigarettes    Quit date: 09/10/1990  . Smokeless tobacco: Former Systems developer    Types: Snuff    Quit date: 04/03/2011  . Alcohol Use: Yes    Review of Systems  Unable to perform ROS: Acuity of condition      Allergies  Review of patient's allergies indicates no known allergies.  Home Medications   Prior to Admission medications   Medication Sig Start Date End Date Taking? Authorizing Provider  albuterol (PROVENTIL) (2.5  MG/3ML) 0.083% nebulizer solution Take 2.5 mg by nebulization every 4 (four) hours as needed.      Historical Provider, MD  albuterol (VENTOLIN HFA) 108 (90 BASE) MCG/ACT inhaler Inhale 2 puffs into the lungs every 4 (four) hours as needed.    Historical Provider, MD  Fluticasone-Salmeterol (ADVAIR DISKUS) 250-50 MCG/DOSE AEPB Inhale 1 puff into the lungs every 12 (twelve) hours.      Historical Provider, MD  LORazepam (ATIVAN) 0.5 MG tablet Take 0.5 mg by mouth every 8 (eight) hours as needed.     Historical Provider, MD  Multiple Vitamin (MULTIVITAMIN) capsule Take 1 capsule by mouth daily.      Historical Provider, MD  naproxen sodium (ANAPROX) 220 MG tablet Take 220 mg by mouth as needed.    Historical Provider, MD  predniSONE (DELTASONE) 10 MG tablet 4 tabs for 2 days, then 3 tabs for 2 days, 2 tabs for 2 days, then 1 tab for 2 days, then stop 12/17/13   Tammy S Parrett, NP  Tamsulosin HCl (FLOMAX) 0.4 MG CAPS Take 0.4 mg by mouth daily.     Historical Provider, MD  tiotropium (SPIRIVA) 18 MCG inhalation capsule Place 18 mcg into inhaler and inhale daily. 03/21/11 08/24/13  Rigoberto Noel, MD  verapamil (CALAN) 120 MG tablet Take 240 mg by mouth daily. Extended release    Historical Provider, MD  BP 182/94  Pulse 98  Resp 24  SpO2 98% Physical Exam  Nursing note and vitals reviewed. Constitutional: He is oriented to person, place, and time. He appears well-developed and well-nourished.  Cachectic chronically ill-appearing male in respiratory distress  HENT:  Head: Normocephalic and atraumatic.  Nose: Nose normal.  Mouth/Throat: Oropharynx is clear and moist.  Eyes: Conjunctivae and EOM are normal. Pupils are equal, round, and reactive to light.  Neck: Normal range of motion. Neck supple. No JVD present. No tracheal deviation present. No thyromegaly present.  Cardiovascular: Normal rate, regular rhythm, normal heart sounds and intact distal pulses.  Exam reveals no gallop and no friction  rub.   No murmur heard. Pulmonary/Chest: No stridor. He is in respiratory distress. He has wheezes. He has no rales. He exhibits no tenderness.  Patient has tachypnea, he has poor air movement.  Patient using accessory muscles tripoding on the bed He has prolonged end expiratory phase with wheezing.  Abdominal: Soft. Bowel sounds are normal. He exhibits no distension and no mass. There is no tenderness. There is no rebound and no guarding.  Musculoskeletal: Normal range of motion. He exhibits no edema and no tenderness.  Lymphadenopathy:    He has no cervical adenopathy.  Neurological: He is alert and oriented to person, place, and time. He exhibits normal muscle tone. Coordination normal.  Skin: Skin is warm and dry. No rash noted. No erythema. No pallor.  Psychiatric: He has a normal mood and affect. His behavior is normal. Judgment and thought content normal.    ED Course  Procedures (including critical care time)  CRITICAL CARE Performed by: Kalman Drape Total critical care time:60 min Critical care time was exclusive of separately billable procedures and treating other patients. Critical care was necessary to treat or prevent imminent or life-threatening deterioration. Critical care was time spent personally by me on the following activities: development of treatment plan with patient and/or surrogate as well as nursing, discussions with consultants, evaluation of patient's response to treatment, examination of patient, obtaining history from patient or surrogate, ordering and performing treatments and interventions, ordering and review of laboratory studies, ordering and review of radiographic studies, pulse oximetry and re-evaluation of patient's condition.  Labs Review Labs Reviewed  CBC WITH DIFFERENTIAL - Abnormal; Notable for the following:    Eosinophils Relative 7 (*)    All other components within normal limits  BASIC METABOLIC PANEL - Abnormal; Notable for the following:     Sodium 125 (*)    Chloride 85 (*)    Glucose, Bld 124 (*)    BUN 5 (*)    Creatinine, Ser 0.44 (*)    All other components within normal limits  TROPONIN I    Imaging Review Dg Chest Port 1 View  02/06/2014   CLINICAL DATA:  Shortness of breath.  COPD.  EXAM: PORTABLE CHEST - 1 VIEW  COMPARISON:  08/14/2013 chest CT  FINDINGS: Chronic, severe pulmonary hyperinflation and apical emphysematous change. There are remote/calcified bilateral pulmonary granulomas. No evidence of air leak, consolidation, edema, or effusion. Normal heart size and upper mediastinal contours when accounting for distortion by rightward rotation. Gas overlapping the lower mediastinum is anterior and subdiaphragmatic based on previous CT.  IMPRESSION: Severe emphysema.  No acute superimposed findings.   Electronically Signed   By: Jorje Guild M.D.   On: 02/06/2014 00:23     EKG Interpretation None      MDM   Final diagnoses:  Respiratory distress  COPD  exacerbation  Hyponatremia   73 year old male with respiratory distress, COPD exacerbation.  Patient to be placed on BiPAP, get continuous neb.  He has received steroids.  The patient is a high risk for intubation.  patient he has been intubated before, he wishes to prevent if at all possible.   will discuss with critical care for admission.    Kalman Drape, MD 02/06/14 [phone]

## 2014-02-06 NOTE — ED Notes (Signed)
Family at bedside. 

## 2014-02-06 NOTE — Progress Notes (Signed)
PULMONARY / CRITICAL CARE MEDICINE   Name: [name] MRN: [id number] DOB: 09/28/1940    ADMISSION DATE:  02/05/2014  PRIMARY SERVICE: PCCM  CHIEF COMPLAINT:   sob  BRIEF PATIENT DESCRIPTION:  73 years old male quit smoking 28 y pta  with PMH relevant for HTN, and COPD with FEV1 of 26% in 2012. Presents with 3 days history of progressive SOB. At arrival to the ED found to be in respiratory distress and hypoxemic as low as 83% on RA. Started on BiPAP with significant improvement.   SIGNIFICANT EVENTS / STUDIES:     LINES / TUBES:    CULTURES: - Sputum culture 5/30 >>   ANTIBIOTICS: - Levaquin 5/30 >>      SUBJECTIVE:   VITAL SIGNS: Temp:  [97.2 F (36.2 C)-97.8 F (36.6 C)] 97.2 F (36.2 C) (05/30 0750) Pulse Rate:  [91-115] 106 (05/30 0901) Resp:  [18-24] 22 (05/30 0901) BP: (96-184)/(61-97) 133/89 mmHg (05/30 0730) SpO2:  [92 %-100 %] 100 % (05/30 0901) FiO2 (%):  [40 %-100 %] 100 % (05/30 0901) Weight:  [111 lb 12.4 oz (50.7 kg)] 111 lb 12.4 oz (50.7 kg) (05/30 0600) HEMODYNAMICS:   VENTILATOR SETTINGS: Vent Mode:  [-] BIPAP FiO2 (%):  [40 %-100 %] 100 % Set Rate:  [15 bmp] 15 bmp INTAKE / OUTPUT: Intake/Output   None     PHYSICAL EXAMINATION: General: Pleasant male patient in mild respiratory distress. Eyes: Anicteric sclerae. ENT: Oropharynx clear. Dry mucous membranes. No thrush Lymph: No cervical, supraclavicular, or axillary lymphadenopathy. Heart: Normal S1, S2. No murmurs, rubs, or gallops appreciated. No bruits, equal pulses. Lungs: Diminished breath sounds bilaterally, without wheezes or crackles. Normal upper airway sounds without evidence of stridor. Abdomen: Abdomen soft, non-tender and not distended, normoactive bowel sounds. No hepatosplenomegaly or masses. Musculoskeletal: No clubbing or synovitis. No edema Skin: No rashes or lesions Neuro: No focal neurologic deficits.   LABS:  CBC  Recent Labs Lab 02/05/14 2340  02/06/14 0457  WBC 5.3 11.1*  HGB 15.4 14.7  HCT 44.3 41.4  PLT 186 182   Coag's No results found for this basename: APTT, INR,  in the last 168 hours BMET  Recent Labs Lab 02/05/14 2340 02/06/14 0457 02/06/14 0723  NA 125* 127* 126*  K 4.1 4.3 5.2  CL 85* 84* 83*  CO2 27 26 29   BUN 5* 8 9  CREATININE 0.44* 0.63 0.66  GLUCOSE 124* 150* 141*   Electrolytes  Recent Labs Lab 02/05/14 2340 02/06/14 0457 02/06/14 0723  CALCIUM 9.0 9.2 9.0   Sepsis Markers No results found for this basename: LATICACIDVEN, PROCALCITON, O2SATVEN,  in the last 168 hours ABG  Recent Labs Lab 02/06/14 0331  PHART 7.418  PCO2ART 47.6*  PO2ART 71.0*   Liver Enzymes No results found for this basename: AST, ALT, ALKPHOS, BILITOT, ALBUMIN,  in the last 168 hours Cardiac Enzymes  Recent Labs Lab 02/05/14 2340  TROPONINI <0.30   Glucose No results found for this basename: GLUCAP,  in the last 168 hours  Imaging Dg Chest Port 1 View  02/06/2014   CLINICAL DATA:  Shortness of breath.  COPD.  EXAM: PORTABLE CHEST - 1 VIEW  COMPARISON:  08/14/2013 chest CT  FINDINGS: Chronic, severe pulmonary hyperinflation and apical emphysematous change. There are remote/calcified bilateral pulmonary granulomas. No evidence of air leak, consolidation, edema, or effusion. Normal heart size and upper mediastinal contours when accounting for distortion by rightward rotation. Gas overlapping the lower mediastinum is anterior and  subdiaphragmatic based on previous CT.  IMPRESSION: Severe emphysema.  No acute superimposed findings.   Electronically Signed   By: Jorje Guild M.D.   On: 02/06/2014 00:23     ASSESSMENT / PLAN:  PULMONARY A: 1) Acute hypoxemic respiratory failure 2) COPD exacerbation 3) FEV1 of 26% on spirometry from 2012 P:     -   BiPAP dep/ titrate   FiO2 - Solumedrol 80mg  IV q8h - Levaquin per dashboard  - RVP, sputum culture per dashboard  - Duoneb q 4 hrs - Albuterol  PRN  CARDIOVASCULAR A:  1) Hemodynamically stable P:  - ICU monitoring  RENAL A:   1) Hyponatremia  2) Normal creatinine P:   - Gentle hydration with NS at 100 cc/hr for 1 L - Will follow BMP    GASTROINTESTINAL A:   1) No issues    HEMATOLOGIC A:  1) No issues    INFECTIOUS A:   1) COPD exacerbation. No clear infiltrates on chest X ray. 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 Will try very low doses of duragesic to reduce wob.   Christinia Gully, MD Pulmonary and [place] [phone] After 5:30 PM or weekends, call [phone]

## 2014-02-07 DIAGNOSIS — R0609 Other forms of dyspnea: Secondary | ICD-10-CM

## 2014-02-07 DIAGNOSIS — R0989 Other specified symptoms and signs involving the circulatory and respiratory systems: Secondary | ICD-10-CM

## 2014-02-07 DIAGNOSIS — J9600 Acute respiratory failure, unspecified whether with hypoxia or hypercapnia: Secondary | ICD-10-CM | POA: Diagnosis present

## 2014-02-07 DIAGNOSIS — J441 Chronic obstructive pulmonary disease with (acute) exacerbation: Secondary | ICD-10-CM

## 2014-02-07 DIAGNOSIS — E871 Hypo-osmolality and hyponatremia: Secondary | ICD-10-CM | POA: Diagnosis present

## 2014-02-07 MED ORDER — PANTOPRAZOLE SODIUM 40 MG PO TBEC
40.0000 mg | DELAYED_RELEASE_TABLET | Freq: Two times a day (BID) | ORAL | Status: DC
  Administered 2014-02-08 – 2014-02-15 (×14): 40 mg via ORAL
  Filled 2014-02-07 (×15): qty 1

## 2014-02-07 MED ORDER — PHENOL 1.4 % MT LIQD
1.0000 | OROMUCOSAL | Status: DC | PRN
  Administered 2014-02-07 – 2014-02-11 (×2): 1 via OROMUCOSAL
  Filled 2014-02-07 (×2): qty 177

## 2014-02-07 MED ORDER — DIAZEPAM 5 MG PO TABS
5.0000 mg | ORAL_TABLET | Freq: Four times a day (QID) | ORAL | Status: DC
  Administered 2014-02-07 – 2014-02-08 (×5): 5 mg via ORAL
  Filled 2014-02-07 (×5): qty 1

## 2014-02-07 MED ORDER — METHYLPREDNISOLONE SODIUM SUCC 125 MG IJ SOLR
80.0000 mg | Freq: Two times a day (BID) | INTRAMUSCULAR | Status: DC
  Administered 2014-02-07 – 2014-02-08 (×2): 80 mg via INTRAVENOUS
  Filled 2014-02-07 (×4): qty 1.28

## 2014-02-07 MED ORDER — CLONIDINE HCL 0.1 MG PO TABS
0.1000 mg | ORAL_TABLET | Freq: Three times a day (TID) | ORAL | Status: DC
  Administered 2014-02-07 – 2014-02-12 (×15): 0.1 mg via ORAL
  Filled 2014-02-07 (×18): qty 1

## 2014-02-07 NOTE — Progress Notes (Signed)
PULMONARY / CRITICAL CARE MEDICINE   Name: [name] MRN: [id number] DOB: April 24, 1941    ADMISSION DATE:  02/05/2014  PRIMARY SERVICE: PCCM  CHIEF COMPLAINT:   sob  BRIEF PATIENT DESCRIPTION:  72 yowm quit smoking 23 y pta  with   HTN, and COPD with FEV1 of 26% in 2012. Presented with 3 days history of progressive SOB. At arrival to the ED found to be in respiratory distress and hypoxemic as low as 83% on RA. Started on BiPAP with significant improvement.   SIGNIFICANT EVENTS / STUDIES:  duragesic added 5/30 @ 25 mcg per hour    LINES / TUBES:    CULTURES: MRSA sreen 5/30 >  neg RVP 5/30 >>> Sputum culture 5/30 > not obtained  ANTIBIOTICS:  Levaquin 5/30 >>      SUBJECTIVE:  Much lower wob on bipap this am, a bit shaky and nursing reports beer up to 12 per day pta   VITAL SIGNS: Temp:  [96 F (35.6 C)-98.1 F (36.7 C)] 98 F (36.7 C) (05/31 0740) Pulse Rate:  [94-113] 102 (05/31 0740) Resp:  [18-21] 20 (05/31 0740) BP: (140-166)/(85-99) 165/94 mmHg (05/31 0740) SpO2:  [94 %-100 %] 94 % (05/31 0740) FiO2 (%):  [40 %-100 %] 40 % (05/31 0748) Weight:  [112 lb 7 oz (51 kg)] 112 lb 7 oz (51 kg) (05/31 0328) HEMODYNAMICS:   VENTILATOR SETTINGS: Vent Mode:  [-] BIPAP FiO2 (%):  [40 %-100 %] 40 % Set Rate:  [15 bmp] 15 bmp INTAKE / OUTPUT: Intake/Output     05/30 0701 - 05/31 0700 05/31 0701 - 06/01 0700   Urine (mL/kg/hr) 600 (0.5)    Total Output 600     Net -600            PHYSICAL EXAMINATION: General: chronically ill barrel shaped mild increased wob at rest at 30 degrees hob with bipap HEENT mild turbinate edema.  Oropharynx no thrush or excess pnd or cobblestoning.  No JVD or cervical adenopathy. Mild accessory muscle hypertrophy. Trachea midline, nl thryroid. Chest was hyperinflated by percussion with diminished breath sounds and moderate increased exp time without wheeze. Hoover sign positive at mid inspiration. Regular rate and rhythm without murmur  gallop or rub or increase P2 or edema.  Abd: no hsm, nl excursion. Ext warm without cyanosis or clubbing.     LABS:  CBC  Recent Labs Lab 02/05/14 2340 02/06/14 0457  WBC 5.3 11.1*  HGB 15.4 14.7  HCT 44.3 41.4  PLT 186 182   Coag's No results found for this basename: APTT, INR,  in the last 168 hours BMET  Recent Labs Lab 02/05/14 2340 02/06/14 0457 02/06/14 0723  NA 125* 127* 126*  K 4.1 4.3 5.2  CL 85* 84* 83*  CO2 27 26 29   BUN 5* 8 9  CREATININE 0.44* 0.63 0.66  GLUCOSE 124* 150* 141*   Electrolytes  Recent Labs Lab 02/05/14 2340 02/06/14 0457 02/06/14 0723  CALCIUM 9.0 9.2 9.0   Sepsis Markers No results found for this basename: LATICACIDVEN, PROCALCITON, O2SATVEN,  in the last 168 hours ABG  Recent Labs Lab 02/06/14 0331  PHART 7.418  PCO2ART 47.6*  PO2ART 71.0*   Liver Enzymes No results found for this basename: AST, ALT, ALKPHOS, BILITOT, ALBUMIN,  in the last 168 hours Cardiac Enzymes  Recent Labs Lab 02/05/14 2340  TROPONINI <0.30   Glucose No results found for this basename: GLUCAP,  in the last 168 hours  Imaging Dg  Chest Port 1 View  02/06/2014   CLINICAL DATA:  Shortness of breath.  COPD.  EXAM: PORTABLE CHEST - 1 VIEW  COMPARISON:  08/14/2013 chest CT  FINDINGS: Chronic, severe pulmonary hyperinflation and apical emphysematous change. There are remote/calcified bilateral pulmonary granulomas. No evidence of air leak, consolidation, edema, or effusion. Normal heart size and upper mediastinal contours when accounting for distortion by rightward rotation. Gas overlapping the lower mediastinum is anterior and subdiaphragmatic based on previous CT.  IMPRESSION: Severe emphysema.  No acute superimposed findings.   Electronically Signed   By: Jorje Guild M.D.   On: 02/06/2014 00:23     ASSESSMENT / PLAN:  PULMONARY A: 1) Acute hypoxemic respiratory failure 2) COPD exacerbation 3) FEV1 of 26% on spirometry from 2012 P:     -    BiPAP dep/ titrate   FiO2 - Solumedrol 80mg  IV q12 - Levaquin per dashboard  -   sputum culture per dashboard but not obtained  - Duoneb q 4 hrs - Albuterol PRN  CARDIOVASCULAR A:  1) hypertension P:  - add clonidine will help with etoh w/d  RENAL A:   1) Hyponatremia   P:   follow BMP   D/c'd hctz 5/31   GASTROINTESTINAL A:   GI prophylaxis:  PPI since on steroids/ aecopd    INFECTIOUS A:   1) COPD exacerbation. No clear infiltrates on chest X ray. 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 He should do better if we treat his anxiety which will reduce RR and airtrapping since he has such severe Exp airflow limitation      The patient is critically ill with multiple organ systems failure and requires high complexity decision making for assessment and support, frequent evaluation and titration of therapies, application of advanced monitoring technologies and extensive interpretation of multiple databases. Critical Care Time devoted to patient care services described in this note is 45 minutes.    Christinia Gully, MD Pulmonary and [place] [phone] After 5:30 PM or weekends, call [phone]

## 2014-02-07 NOTE — Progress Notes (Signed)
Spoke with [place], RN with [facility] -- pt came off Bi-pap for a few hours this morning and tolerated 4-6L [place] well. Just took pt off Bi-pap for a few minutes and pt couldn't tolerate [place] (he had significant increased work of breathing, using all accessory muscles). Pt more relaxed since being placed back on Bi-pap. Pt was also started on Valium today. Kirby Crigler [employer]

## 2014-02-07 NOTE — Progress Notes (Signed)
Patient requested BiPAP at this time for SOB. Placed on 10/5 and 40%. RT will continue to monitor.

## 2014-02-07 NOTE — Progress Notes (Signed)
Placed social services consult per patient request -- pt stated to me that he would like his step-daughter, Linna Darner, to be his healthcare power of attorney. Chaplain notified. Kirby Crigler [employer]

## 2014-02-08 LAB — RESPIRATORY VIRUS PANEL
Adenovirus: NOT DETECTED
INFLUENZA A H3: NOT DETECTED
INFLUENZA B 1: NOT DETECTED
Influenza A H1: NOT DETECTED
Influenza A: NOT DETECTED
METAPNEUMOVIRUS: NOT DETECTED
PARAINFLUENZA 1 A: NOT DETECTED
PARAINFLUENZA 3 A: NOT DETECTED
Parainfluenza 2: NOT DETECTED
Respiratory Syncytial Virus A: NOT DETECTED
Respiratory Syncytial Virus B: NOT DETECTED
Rhinovirus: NOT DETECTED

## 2014-02-08 LAB — BASIC METABOLIC PANEL
BUN: 22 mg/dL (ref 6–23)
CALCIUM: 9.1 mg/dL (ref 8.4–10.5)
CO2: 35 mEq/L — ABNORMAL HIGH (ref 19–32)
CREATININE: 0.65 mg/dL (ref 0.50–1.35)
Chloride: 85 mEq/L — ABNORMAL LOW (ref 96–112)
Glucose, Bld: 95 mg/dL (ref 70–99)
Potassium: 5 mEq/L (ref 3.7–5.3)
Sodium: 128 mEq/L — ABNORMAL LOW (ref 137–147)

## 2014-02-08 LAB — MAGNESIUM: MAGNESIUM: 1.9 mg/dL (ref 1.5–2.5)

## 2014-02-08 MED ORDER — PREDNISONE 20 MG PO TABS
40.0000 mg | ORAL_TABLET | Freq: Every day | ORAL | Status: DC
  Administered 2014-02-09 – 2014-02-13 (×5): 40 mg via ORAL
  Filled 2014-02-08 (×7): qty 2

## 2014-02-08 MED ORDER — LEVOFLOXACIN 500 MG PO TABS
500.0000 mg | ORAL_TABLET | Freq: Every day | ORAL | Status: DC
  Administered 2014-02-08 – 2014-02-12 (×4): 500 mg via ORAL
  Filled 2014-02-08 (×5): qty 1

## 2014-02-08 MED ORDER — LORAZEPAM 0.5 MG PO TABS
0.5000 mg | ORAL_TABLET | Freq: Three times a day (TID) | ORAL | Status: DC | PRN
  Administered 2014-02-08 – 2014-02-09 (×3): 0.5 mg via ORAL
  Filled 2014-02-08 (×3): qty 1

## 2014-02-08 MED ORDER — WHITE PETROLATUM GEL
Status: AC
  Administered 2014-02-08: 21:00:00
  Filled 2014-02-08: qty 5

## 2014-02-08 NOTE — Progress Notes (Signed)
Chaplain responded to referral from RN concerning AD. Pt stated he had it "all worked out with hospice," but then talked at length about his mother's funeral. Bonney Roussel explained AD does not apply to funeral arrangements but is to elect a healthcare POA and specify his treatment wishes in the event he can't make his own decisions. Pt stated he would look over paperwork.  Chaplain available for follow up if necessary.

## 2014-02-08 NOTE — Progress Notes (Signed)
PULMONARY / CRITICAL CARE MEDICINE   Name: [name] MRN: [id number] DOB: 01/04/1941    ADMISSION DATE:  02/05/2014 LOS 3 days   PRIMARY SERVICE: PCCM  CHIEF COMPLAINT:   sob  BRIEF PATIENT DESCRIPTION:  61 yowm quit smoking 23 y pta  with   HTN, and COPD with FEV1 of 26% in 2012. Presented with 3 days history of progressive SOB. At arrival to the ED found to be in respiratory distress and hypoxemic as low as 83% on RA. Started on BiPAP with significant improvement.   SIGNIFICANT EVENTS / STUDIES:  duragesic added 5/30 @ 25 mcg per hour    LINES / TUBES:    CULTURES: MRSA sreen 5/30 >  neg RVP 5/30 >>> Sputum culture 5/30 > not obtained  ANTIBIOTICS:  Levaquin 5/30 >>      SUBJECTIVE:  02/07/14: Much lower wob on bipap this am, a bit shaky and nursing reports beer up to 12 per day pta  02/08/14: appears to be sleeping comfortably   VITAL SIGNS: Temp:  [97.3 F (36.3 C)-98.3 F (36.8 C)] 97.8 F (36.6 C) (06/01 0705) Pulse Rate:  [90-114] 103 (06/01 0705) Resp:  [15-36] 19 (06/01 0705) BP: (127-149)/(75-90) 129/75 mmHg (06/01 0705) SpO2:  [87 %-100 %] 98 % (06/01 1008) FiO2 (%):  [40 %] 40 % (06/01 0359) Weight:  [50.3 kg (110 lb 14.3 oz)] 50.3 kg (110 lb 14.3 oz) (06/01 0558) HEMODYNAMICS:   VENTILATOR SETTINGS: Vent Mode:  [-] BIPAP FiO2 (%):  [40 %] 40 % Set Rate:  [15 bmp] 15 bmp INTAKE / OUTPUT: Intake/Output     05/31 0701 - 06/01 0700 06/01 0701 - 06/02 0700   P.O. 120    Total Intake(mL/kg) 120 (2.4)    Urine (mL/kg/hr) 750 (0.6) 150 (0.8)   Total Output 750 150   Net -630 -150          PHYSICAL EXAMINATION: General: chronically ill barrel shaped mild increased wob at rest at 30 degrees hob with bipap HEENT mild turbinate edema.  Oropharynx no thrush or excess pnd or cobblestoning.  No JVD or cervical adenopathy. Mild accessory muscle hypertrophy. Trachea midline, nl thryroid. Chest was hyperinflated by percussion with diminished breath  sounds and moderate increased exp time without wheeze. Hoover sign positive at mid inspiration. Regular rate and rhythm without murmur gallop or rub or increase P2 or edema.  Abd: no hsm, nl excursion. Ext warm without cyanosis or clubbing.     LABS:  CBC  PULMONARY  Recent Labs Lab 02/06/14 0331  PHART 7.418  PCO2ART 47.6*  PO2ART 71.0*  HCO3 30.8*  TCO2 32  O2SAT 94.0    CBC  Recent Labs Lab 02/05/14 2340 02/06/14 0457  HGB 15.4 14.7  HCT 44.3 41.4  WBC 5.3 11.1*  PLT 186 182    COAGULATION No results found for this basename: INR,  in the last 168 hours  CARDIAC   Recent Labs Lab 02/05/14 2340  TROPONINI <0.30   No results found for this basename: PROBNP,  in the last 168 hours   CHEMISTRY  Recent Labs Lab 02/05/14 2340 02/06/14 0457 02/06/14 0723 02/08/14 0319  NA 125* 127* 126* 128*  K 4.1 4.3 5.2 5.0  CL 85* 84* 83* 85*  CO2 27 26 29  35*  GLUCOSE 124* 150* 141* 95  BUN 5* 8 9 22   CREATININE 0.44* 0.63 0.66 0.65  CALCIUM 9.0 9.2 9.0 9.1  MG  --   --   --  1.9   Estimated Creatinine Clearance: 59.4 ml/min (by C-G formula based on Cr of 0.65).   LIVER No results found for this basename: AST, ALT, ALKPHOS, BILITOT, PROT, ALBUMIN, INR,  in the last 168 hours   INFECTIOUS No results found for this basename: LATICACIDVEN, PROCALCITON,  in the last 168 hours   ENDOCRINE CBG (last 3)  No results found for this basename: GLUCAP,  in the last 72 hours       IMAGING x48h  No results found.    ASSESSMENT / PLAN:  PULMONARY A: 1) Acute hypoxemic respiratory failure 2) COPD exacerbation 3) FEV1 of 26% on spirometry from 2012;  baseline doe x 25 ft on 02    - currently sleeping P:     -   BiPAP dep/ titrate   FiO2 prn - Solumedrol 80mg  IV q12; change to prednisone - Levaquin per dashboard ; change PO -   sputum culture per dashboard but not obtained  - Duoneb q 4 hrs - Albuterol PRN  CARDIOVASCULAR A:  1)  hypertension P:  - add clonidine will help with etoh w/d  RENAL A:   1) Hyponatremia   P:   follow BMP   D/c'd hctz 5/31   GASTROINTESTINAL A:   GI prophylaxis:  PPI since on steroids/ aecopd    INFECTIOUS A:   1) COPD exacerbation. No clear infiltrates on chest X ray. P:   - per dashboard   CNS A: Baseline anxiety. Home meds list ativan 0.5mg  q8h prn. Currently listed to take valium 5mg  q6h scheduled. Patient unclear what he is on (RN just asked him(  P Change to [place] patch for now; will consider easily titratable syrup morphine   GLOBAL 02/08/14: Per RN: Lives with daughter.  Apparently patient wants to make step daughter HCPOA. But these 2 women do not get along. 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      Dr. Brand Males, M.D., [facility].C.P Pulmonary and Critical Care Medicine Staff Physician [place] [facility] Pager: [phone], If no answer or between  15:00h - 7:00h: call [phone]  02/08/2014 10:46 AM

## 2014-02-08 NOTE — Progress Notes (Signed)
Utilization review completed.  

## 2014-02-09 DIAGNOSIS — Z515 Encounter for palliative care: Secondary | ICD-10-CM

## 2014-02-09 DIAGNOSIS — F1010 Alcohol abuse, uncomplicated: Secondary | ICD-10-CM

## 2014-02-09 LAB — CBC WITH DIFFERENTIAL/PLATELET
Basophils Absolute: 0 10*3/uL (ref 0.0–0.1)
Basophils Relative: 0 % (ref 0–1)
EOS ABS: 0 10*3/uL (ref 0.0–0.7)
EOS PCT: 0 % (ref 0–5)
HCT: 43.5 % (ref 39.0–52.0)
Hemoglobin: 15.2 g/dL (ref 13.0–17.0)
Lymphocytes Relative: 3 % — ABNORMAL LOW (ref 12–46)
Lymphs Abs: 0.6 10*3/uL — ABNORMAL LOW (ref 0.7–4.0)
MCH: 32.8 pg (ref 26.0–34.0)
MCHC: 34.9 g/dL (ref 30.0–36.0)
MCV: 94 fL (ref 78.0–100.0)
Monocytes Absolute: 1.2 10*3/uL — ABNORMAL HIGH (ref 0.1–1.0)
Monocytes Relative: 6 % (ref 3–12)
NEUTROS PCT: 91 % — AB (ref 43–77)
Neutro Abs: 17.6 10*3/uL — ABNORMAL HIGH (ref 1.7–7.7)
PLATELETS: 163 10*3/uL (ref 150–400)
RBC: 4.63 MIL/uL (ref 4.22–5.81)
RDW: 12.6 % (ref 11.5–15.5)
WBC: 19.4 10*3/uL — ABNORMAL HIGH (ref 4.0–10.5)

## 2014-02-09 LAB — BASIC METABOLIC PANEL
BUN: 23 mg/dL (ref 6–23)
CALCIUM: 9.3 mg/dL (ref 8.4–10.5)
CO2: 33 mEq/L — ABNORMAL HIGH (ref 19–32)
CREATININE: 0.66 mg/dL (ref 0.50–1.35)
Chloride: 87 mEq/L — ABNORMAL LOW (ref 96–112)
GFR calc Af Amer: >90 mL/min (ref >90)
GLUCOSE: 92 mg/dL (ref 70–99)
Potassium: 4.9 mEq/L (ref 3.7–5.3)
Sodium: 128 mEq/L — ABNORMAL LOW (ref 137–147)

## 2014-02-09 LAB — PHOSPHORUS: Phosphorus: 2.9 mg/dL (ref 2.3–4.6)

## 2014-02-09 LAB — MAGNESIUM: Magnesium: 2 mg/dL (ref 1.5–2.5)

## 2014-02-09 MED ORDER — SENNOSIDES-DOCUSATE SODIUM 8.6-50 MG PO TABS
2.0000 | ORAL_TABLET | Freq: Two times a day (BID) | ORAL | Status: DC
  Administered 2014-02-09 – 2014-02-15 (×12): 2 via ORAL
  Filled 2014-02-09 (×13): qty 2

## 2014-02-09 MED ORDER — LORAZEPAM 2 MG/ML IJ SOLN
INTRAMUSCULAR | Status: AC
  Filled 2014-02-09: qty 1

## 2014-02-09 MED ORDER — MORPHINE SULFATE 2 MG/ML IJ SOLN: 1.0000 mg | INTRAMUSCULAR | Status: DC | PRN

## 2014-02-09 MED ORDER — LORAZEPAM 2 MG/ML IJ SOLN
0.5000 mg | Freq: Once | INTRAMUSCULAR | Status: AC
  Administered 2014-02-09: 0.5 mg via INTRAVENOUS

## 2014-02-09 MED ORDER — LORAZEPAM 2 MG/ML IJ SOLN
0.5000 mg | INTRAMUSCULAR | Status: DC | PRN
  Filled 2014-02-09: qty 1

## 2014-02-09 MED ORDER — MORPHINE SULFATE 10 MG/5ML PO SOLN
4.0000 mg | Freq: Four times a day (QID) | ORAL | Status: DC
  Administered 2014-02-09: 4 mg via ORAL
  Filled 2014-02-09: qty 5

## 2014-02-09 MED ORDER — LORAZEPAM 0.5 MG PO TABS
0.5000 mg | ORAL_TABLET | Freq: Two times a day (BID) | ORAL | Status: DC
  Administered 2014-02-09 – 2014-02-12 (×6): 0.5 mg via ORAL
  Filled 2014-02-09 (×6): qty 1

## 2014-02-09 MED ORDER — MORPHINE SULFATE 10 MG/5ML PO SOLN
2.5000 mg | Freq: Four times a day (QID) | ORAL | Status: DC
  Administered 2014-02-09 – 2014-02-11 (×4): 2.5 mg via ORAL
  Filled 2014-02-09 (×4): qty 5

## 2014-02-09 MED ORDER — MORPHINE SULFATE 4 MG/ML IJ SOLN
INTRAMUSCULAR | Status: AC
  Administered 2014-02-09: 4 mg
  Filled 2014-02-09: qty 1

## 2014-02-09 MED ORDER — HALOPERIDOL LACTATE 5 MG/ML IJ SOLN
INTRAMUSCULAR | Status: AC
  Filled 2014-02-09: qty 1

## 2014-02-09 MED ORDER — MAGNESIUM SULFATE 40 MG/ML IJ SOLN
2.0000 g | Freq: Once | INTRAMUSCULAR | Status: AC
  Administered 2014-02-09: 2 g via INTRAVENOUS
  Filled 2014-02-09: qty 50

## 2014-02-09 MED ORDER — HALOPERIDOL LACTATE 5 MG/ML IJ SOLN
5.0000 mg | Freq: Once | INTRAMUSCULAR | Status: AC
  Administered 2014-02-09: 5 mg via INTRAVENOUS

## 2014-02-09 MED ORDER — MORPHINE SULFATE 4 MG/ML IJ SOLN
4.0000 mg | Freq: Once | INTRAMUSCULAR | Status: AC
  Administered 2014-02-09: 4 mg via INTRAVENOUS

## 2014-02-09 NOTE — Progress Notes (Signed)
Respiratory therapy note-Patient placed back on Bipap due to work of breathing and HR 130's, RN notified and suggested to call CCM.

## 2014-02-09 NOTE — Clinical Social Work Note (Signed)
Spiritual care to address advanced directives.  Liz Beach MSW, [place], [place], [phone]

## 2014-02-09 NOTE — Progress Notes (Signed)
[facility] Progress Note Patient Name: [name] DOB: 1941-08-24 MRN: [id number]  Date of Service  02/09/2014   HPI/Events of Note  Call from nurse earlier reporting that patient was agitated - is on scheduled ativan.  Given additional 0.5 mg ativan IV with not improvement.  Pulling off BiPAP, confused and agitated.  HD stable.  QTc on EKG is less than 500.   eICU Interventions  Plan: 5 mg Haldol IV now.  If no response in 30 minutes nurse is to call.  Consider dose escalation if patient continues to be confused.   Intervention Category Major Interventions: Delirium, psychosis, severe agitation - evaluation and management  Guadelupe Sabin Chanan Detwiler 02/09/2014, 2:32 AM

## 2014-02-09 NOTE — Consult Note (Signed)
[name]      DOB: 02/24/1941      UVO:[id number]  Summary of Goals of care ; full note to follow:  Met with biological daughter April and her boyfriend Roderic Palau .  We were joined by her step sister Loletha Carrow  The patient came awake at the end of the meeting but lacks insight and judgement into his illness and is agitated to the point of not being able to have a productive conversation.  We did attempt to gain insight into his wishes but he was not able to coherent contribute.  He is sharing his wishes equally with both the girls.  His daughter states at this time that she would like to keep him a full code.  He would prefer to not be in bipap if at possible and we were able to transition him to [facility].  If possible would try to transition to a [place].  I suspect that we will need to bridge him with bipap to give daughter time to process a decline.  Agree that morphine is likely going to help with air hunger would consider using 2.5 and titrating up if needed as he has been sedated all day until this evening. Will ask my team to try to talk through goals of care again tomorrow, and establish a surrogate .  For now he has no advanced directives.  He lives with his biological daughter who has strong influences over him.  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  He gets easily flustered when he doesn't know what is being said. All he knows is that he wants out of the hospital now.   Recommend:   1.  Full code for now.  Continue to work with patient and daughter on decision.  2.  Dyspnea: decrease morphine to 2.5 but keep it scheduled.  If that does keeps him sedated then would go to prn  3. Alcohol abuse- continue with ativan q 12   Total time: 4 pm- 530 pm

## 2014-02-09 NOTE — Consult Note (Signed)
Patient PI:RJJOAC OMKAR STRATMANN      DOB: December 05, 1940      ZYS:[id number]     Consult Note from the Palliative Medicine Team at [facility] Requested by: Dr. Carney Living     PCP: Curly Rim, MD Reason for Consultation: Goals of care    Phone [phone]  Assessment of patients Current state: 73 yr old white male currently sedated on bipap.  Admitted with respiratory failure, exacerbation of emphysema.  I met with the patient's biological duaghter April and her boyfriend Roderic Palau who live with and are supposed to be helping to care for [place].  April tells me he has been living in a 71 x12 area of the house for some time. He is only able to sit at his table and use his computer and is limited by shortness of breath when walking. His step daughter arrived halfway through the meeting and was able to talk with April about her father's limited and declining existence.  April stated at this time she could not change his status to a DNR and wanted to try to include him on there decisions.  We returned to the room to see if he could participate. He was slightly more awake and asked to come off bipap.  Once off bipap he proceeded to demand that the family take him home right then.  We were able to negotiate at least staying the night but the patient was clearly confused and with out judgement or insight into the severity of his illness.  His family wanted to try to talk with him about his code status.  He ultimately stated he would want to be resuscitated but he does not have full capacity for this decision. His daughter despite this knowledge desires to honor his wishes at this time. We agreed to try to titrate his medications to give him optimal comfort and ability to turn the corner and revisit with him in the AM.     Goals of Care: 1.  Code Status: Full code   2. Scope of Treatment: Continue to treat for cure but allow meds for comfort as well.     4. Disposition: to be  determined   3. Symptom Management:   1. Anxiety/Agitation: agree with ativan low dose q 12 for now. He drinks daily at home. 2. Pain/ dypnea agree with morphine, but would decrease dose to 2.5 mg scheduled as he has been too sedated 3. Bowel Regimen: monitor while on opiates. 4. Delirium/ likely multifactorial- etoh abuse, metabolic/respiratory   4. Psychosocial: Biological daughter is currently care giver and go to surrogate, step daughter involved in care but there is unspoken strain between daughters.  5. Spiritual: offered as needed.        Patient Documents Completed or Given: Document Given Completed  Advanced Directives Pkt    MOST    DNR    Gone from My Sight    Hard Choices      Brief HPI: 73 yr old white male with advanced end stage emphysema admitted with exacerbation requiring continuous bipap.  We were asked to assist with goals of care.   ROS: " I want to go home now".   Denies pain, does not feel short of breath,  Denies nausea or vomiting.  Intermittent cough.    PMH:  Past Medical History  Diagnosis Date  . HTN (hypertension)   . Asthma   . 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 COPD (  chronic obstructive pulmonary disease)   . Tumor cells, malignant     intestinal  . Pneumonia 05/10/11    hospitalized     PSH: Past Surgical History  Procedure Laterality Date  . Hemorrhoid surgery    . Large intestinal surgery  02/16/2011    cancer- right colectomy   I have reviewed the [place] and SH and  If appropriate update it with new information. No Known Allergies Scheduled Meds: . antiseptic oral rinse  15 mL Mouth Rinse q12n4p  . cloNIDine  0.1 mg Oral TID  . heparin  5,000 Units Subcutaneous 3 times per day  . ipratropium-albuterol  3 mL Nebulization Q4H  . levofloxacin  500 mg Oral Daily  . LORazepam  0.5 mg Oral Q12H  . morphine  4 mg Oral 4 times per day  . pantoprazole  40 mg Oral BID AC  . predniSONE  40 mg Oral Q breakfast  .  senna-docusate  2 tablet Oral BID   Continuous Infusions:  PRN Meds:.sodium chloride, albuterol, LORazepam, morphine injection, phenol    BP 120/83  Pulse 122  Temp(Src) 97.6 F (36.4 C) (Axillary)  Resp 22  Ht $R'5\' 1"'hw$  (1.549 m)  Wt 48.4 kg (106 lb 11.2 oz)  BMI 20.17 kg/m2  SpO2 90%   PPS:30-40%   Intake/Output Summary (Last 24 hours) at 02/09/14 1807 Last data filed at 02/09/14 1500  Gross per 24 hour  Intake    300 ml  Output    875 ml  Net   -575 ml    Physical Exam:  General: increased use of accessory muscles, purse lip breathing, poor insight into current illness HEENT:  PERRL, EOMI, mmm , very short temper,  Chest:   Decreased with wheezing throughout, no rhonchi CVS: tachy, s1, s2 Abdomen:soft, not distended, positive bowel sounds Ext: multiple areas of ecchymosis, thin skin  Neuro:oriented to self and family, exasperated at being in the hospital but does not have insight into how sick he is.  Labs: CBC    Component Value Date/Time   WBC 19.4* 02/09/2014 0311   RBC 4.63 02/09/2014 0311   HGB 15.2 02/09/2014 0311   HCT 43.5 02/09/2014 0311   PLT 163 02/09/2014 0311   MCV 94.0 02/09/2014 0311   MCH 32.8 02/09/2014 0311   MCHC 34.9 02/09/2014 0311   RDW 12.6 02/09/2014 0311   LYMPHSABS 0.6* 02/09/2014 0311   MONOABS 1.2* 02/09/2014 0311   EOSABS 0.0 02/09/2014 0311   BASOSABS 0.0 02/09/2014 0311      CMP     Component Value Date/Time   NA 128* 02/09/2014 0311   K 4.9 02/09/2014 0311   CL 87* 02/09/2014 0311   CO2 33* 02/09/2014 0311   GLUCOSE 92 02/09/2014 0311   BUN 23 02/09/2014 0311   CREATININE 0.66 02/09/2014 0311   CALCIUM 9.3 02/09/2014 0311   PROT 5.4* 04/10/2011 0338   ALBUMIN 2.6* 04/10/2011 0338   AST 19 04/10/2011 0338   ALT 11 04/10/2011 0338   ALKPHOS 53 04/10/2011 0338   BILITOT 0.7 04/10/2011 0338   GFRNONAA >90 02/09/2014 0311   GFRAA >90 02/09/2014 0311    Chest Xray Reviewed/Impressions: Severe emphysema. No acute superimposed findings.      Time In Time Out  Total Time Spent with Patient Total Overall Time  4  530 pm  30 min 90 min    Greater than 50%  of this time was spent counseling and coordinating care related to the above assessment and  plan.  Darry Kelnhofer L. Lovena Le, MD MBA The Palliative Medicine Team at [facility] Phone: [phone] Pager: [phone]

## 2014-02-09 NOTE — Progress Notes (Signed)
RT arrived to room to give patient breathing treatment. Pt found with increase WOB and SOB. Pt seemed to be very agitated with staff because he could not find phone. Sats 87%, RR 38. RT tried to work with patient to do deep breathing and there was no improvement. Pt placed on BiPAP. Pt calmed down after a few minutes. RT will continue to monitor.

## 2014-02-09 NOTE — Progress Notes (Signed)
Patient QH:UTMLYY [name]      DOB: 1940-12-08      TKP:[id number]  PMT consult received.  Arrived to find patient on bipap.  Somnolent not able to interact. Spoke with nursing, patient had expressed some wishes including possibly using another family member for surrogate.  He lives with daughter April.  He also has a step daughter named Jocelyn Lamer, evidently the two woman may not seem eye to eye regarding his care.  By law must check with natural child since patient can't guide me.  Will gently inquire with natural daughter April.  April would like to meet at 4 pm today.   Gracen Southwell L. Lovena Le, MD MBA The Palliative Medicine Team at [facility] Phone: [phone] Pager: [phone]

## 2014-02-09 NOTE — Progress Notes (Signed)
PULMONARY / CRITICAL CARE MEDICINE   Name: [name] MRN: [id number] DOB: 1940-11-19    ADMISSION DATE:  02/05/2014 LOS 4 days   PRIMARY SERVICE: PCCM  CHIEF COMPLAINT:   sob  BRIEF PATIENT DESCRIPTION:  46 yowm quit smoking 23 y pta  with   HTN, and COPD with FEV1 of 26% in 2012. Presented with 3 days history of progressive SOB. At arrival to the ED found to be in respiratory distress and hypoxemic as low as 83% on RA. Started on BiPAP with significant improvement.   SIGNIFICANT EVENTS / STUDIES:  duragesic added 5/30 @ 25 mcg per hour - stopped 02/09/14 Scheduled Po morphine 02/09/14 >>    LINES / TUBES:    CULTURES: MRSA sreen 5/30 >  neg RVP 5/30 >>>neg Sputum culture 5/30 > not obtained  ANTIBIOTICS:  Levaquin 5/30 >>      SUBJECTIVE:  02/07/14: Much lower wob on bipap this am, a bit shaky and nursing reports beer up to 12 per day pta  02/08/14: appears to be sleeping comfortably   02/09/14: Increased respiratory distress and wob with desats and air hunger and agitation. Rosine Beat with morphine IV and ativan IV and some TLC. During time of acute distress indicated to RNs that he would like to be comfortable and DNR and DNI and no bipap. When MD arrived he said he is not sure but did admit that his life at home is worse than in hospital and is miserable due to class 3-4 dyspnea on account of copd. Also, indicated step daughter should be HCPOA (no formal paper work). He says he is uanble to decide on DNI but wants help with family to make this decision   VITAL SIGNS: Temp:  [97.1 F (36.2 C)-98.3 F (36.8 C)] 98.3 F (36.8 C) (06/02 0800) Pulse Rate:  [106-125] 122 (06/02 0518) Resp:  [18-28] 22 (06/02 0518) BP: (121-151)/(76-95) 136/94 mmHg (06/02 0800) SpO2:  [88 %-99 %] 95 % (06/02 0904) FiO2 (%):  [30 %-40 %] 30 % (06/02 0914) Weight:  [48.4 kg (106 lb 11.2 oz)] 48.4 kg (106 lb 11.2 oz) (06/02 0438) HEMODYNAMICS:   VENTILATOR SETTINGS: Vent Mode:  [-]  BIPAP FiO2 (%):  [30 %-40 %] 30 % Set Rate:  [10 bmp-15 bmp] 10 bmp PEEP:  [5 cmH20] 5 cmH20 Pressure Support:  [5 cmH20] 5 cmH20 INTAKE / OUTPUT: Intake/Output     06/01 0701 - 06/02 0700 06/02 0701 - 06/03 0700   P.O. 300    Total Intake(mL/kg) 300 (6.2)    Urine (mL/kg/hr) 1050 (0.9) 150 (1)   Total Output 1050 150   Net -750 -150          PHYSICAL EXAMINATION:  BP 136/94  Pulse 122  Temp(Src) 98.3 F (36.8 C) (Axillary)  Resp 22  Ht 5\' 1"  (1.549 m)  Wt 48.4 kg (106 lb 11.2 oz)  BMI 20.17 kg/m2  SpO2 95%  General Appearance:  Critically ill looking  Head:    Normocephalic, without obvious abnormality, atraumatic  Eyes:    PERRL, conjunctiva/corneas clear, EOM's intact, fundi    benign, both eyes       Ears:    Normal TM's and external ear canals, both ears  Nose:   Nares normal, septum midline, mucosa normal, no drainage   or sinus tenderness  Throat:   Lips, mucosa, and tongue normal; teeth and gums normal  Neck:   Supple, symmetrical, trachea midline, no adenopathy;  thyroid:  No enlargement/tenderness/nodules; no carotid   bruit or JVD  Back:     Symmetric, no curvature, ROM normal, no CVA tenderness  Lungs:     Labored, using acc muscles, some grunting but not paradoxicalm no wheeze  Chest wall:  Huge barrell chest  Heart:    Regular rate and rhythm, S1 and S2 normal, no murmur, rub   or gallop  Abdomen:     Soft, non-tender, bowel sounds active all four quadrants,    no masses, no organomegaly  Genitalia:    Normal male without lesion, discharge or tenderness  Rectal:    Normal tone, normal prostate, no masses or tenderness;   guaiac negative stool  Extremities:   Extremities normal, atraumatic, no cyanosis or edema  Pulses:   2+ and symmetric all extremities  Skin:   Skin color, texture, turgor normal, no rashes or lesions  Lymph nodes:   Cervical, supraclavicular, and axillary nodes normal  Neurologic:  RASS -1 and answering simple questions       LABS:  CBC  PULMONARY  Recent Labs Lab 02/06/14 0331  PHART 7.418  PCO2ART 47.6*  PO2ART 71.0*  HCO3 30.8*  TCO2 32  O2SAT 94.0    CBC  Recent Labs Lab 02/05/14 2340 02/06/14 0457 02/09/14 0311  HGB 15.4 14.7 15.2  HCT 44.3 41.4 43.5  WBC 5.3 11.1* 19.4*  PLT 186 182 163    COAGULATION No results found for this basename: INR,  in the last 168 hours  CARDIAC    Recent Labs Lab 02/05/14 2340  TROPONINI <0.30   No results found for this basename: PROBNP,  in the last 168 hours   CHEMISTRY  Recent Labs Lab 02/05/14 2340 02/06/14 0457 02/06/14 0723 02/08/14 0319 02/09/14 0311  NA 125* 127* 126* 128* 128*  K 4.1 4.3 5.2 5.0 4.9  CL 85* 84* 83* 85* 87*  CO2 27 26 29  35* 33*  GLUCOSE 124* 150* 141* 95 92  BUN 5* 8 9 22 23   CREATININE 0.44* 0.63 0.66 0.65 0.66  CALCIUM 9.0 9.2 9.0 9.1 9.3  MG  --   --   --  1.9 2.0  PHOS  --   --   --   --  2.9   Estimated Creatinine Clearance: 57.1 ml/min (by C-G formula based on Cr of 0.66).   LIVER No results found for this basename: AST, ALT, ALKPHOS, BILITOT, PROT, ALBUMIN, INR,  in the last 168 hours   INFECTIOUS No results found for this basename: LATICACIDVEN, PROCALCITON,  in the last 168 hours   ENDOCRINE CBG (last 3)  No results found for this basename: GLUCAP,  in the last 72 hours       IMAGING x48h  No results found.    ASSESSMENT / PLAN:  PULMONARY A: 1) Acute hypoxemic respiratory failure 2) COPD exacerbation 3) FEV1 of 26% on spirometry from 2012;  baseline doe x 25 ft on 02    - severe distress needing prn morphine and ativan IV with bipap P:   - start scheduled oral morphine + prn IV (dc patch due to need for acute titration)  - continue scheduled benzo + prn -   BiPAP dep/ titrate   FiO2 prn -  prednisone - Levaquin per dashboard ; - Duoneb q 4 hrs - Albuterol PRN  CARDIOVASCULAR A:  1) hypertension P:  - clonidine will help with etoh w/d  RENAL A:    1) Hyponatremia   P:  follow BMP   D/c'd hctz 5/31   GASTROINTESTINAL A:   GI prophylaxis:  PPI since on steroids/ aecopd    INFECTIOUS A:   1) COPD exacerbation. No clear infiltrates on chest X ray. P:   - per dashboard   CNS A: Baseline anxiety. Home meds list ativan 0.5mg  q8h prn. Patient unclear what he is on    P  ativan scheduled Dc fent patch Use scheduled po morphine for dyspnea   GLOBAL 02/08/14: Per RN: Lives with daughter.  Apparently patient wants to make step daughter HCPOA. But these 2 women do not get along. For now full code  02/09/14: HE is confused about his goals of care and wants help with decision making. Will call palliative care. From PCCM stand point, he is hospice eligible and DNI more appropriate.   PLAN  - social work consult  - palliative care consult for symptoms and goals    He appears to have endstage emphysema clinically withbut still listed as full code.   h  The patient is critically ill with multiple organ systems failure and requires high complexity decision making for assessment and support, frequent evaluation and titration of therapies, application of advanced monitoring technologies and extensive interpretation of multiple databases.   Critical Care Time devoted to patient care services described in this note is  35  Minutes.  Dr. Brand Males, M.D., [facility].C.P Pulmonary and Critical Care Medicine Staff Physician [place] [facility] Pager: [phone], If no answer or between  15:00h - 7:00h: call [phone]  02/09/2014 10:13 AM

## 2014-02-10 DIAGNOSIS — J449 Chronic obstructive pulmonary disease, unspecified: Secondary | ICD-10-CM

## 2014-02-10 LAB — CBC WITH DIFFERENTIAL/PLATELET
Basophils Absolute: 0 10*3/uL (ref 0.0–0.1)
Basophils Relative: 0 % (ref 0–1)
Eosinophils Absolute: 0 10*3/uL (ref 0.0–0.7)
Eosinophils Relative: 0 % (ref 0–5)
HEMATOCRIT: 44.9 % (ref 39.0–52.0)
Hemoglobin: 15.2 g/dL (ref 13.0–17.0)
LYMPHS ABS: 0.3 10*3/uL — AB (ref 0.7–4.0)
LYMPHS PCT: 2 % — AB (ref 12–46)
MCH: 32.5 pg (ref 26.0–34.0)
MCHC: 33.9 g/dL (ref 30.0–36.0)
MCV: 95.9 fL (ref 78.0–100.0)
MONO ABS: 0.9 10*3/uL (ref 0.1–1.0)
MONOS PCT: 6 % (ref 3–12)
Neutro Abs: 12.4 10*3/uL — ABNORMAL HIGH (ref 1.7–7.7)
Neutrophils Relative %: 92 % — ABNORMAL HIGH (ref 43–77)
Platelets: 144 10*3/uL — ABNORMAL LOW (ref 150–400)
RBC: 4.68 MIL/uL (ref 4.22–5.81)
RDW: 12.8 % (ref 11.5–15.5)
WBC: 13.6 10*3/uL — ABNORMAL HIGH (ref 4.0–10.5)

## 2014-02-10 LAB — BASIC METABOLIC PANEL
BUN: 27 mg/dL — ABNORMAL HIGH (ref 6–23)
CHLORIDE: 86 meq/L — AB (ref 96–112)
CO2: 36 meq/L — AB (ref 19–32)
Calcium: 9.3 mg/dL (ref 8.4–10.5)
Creatinine, Ser: 0.68 mg/dL (ref 0.50–1.35)
GFR calc Af Amer: >90 mL/min (ref >90)
GFR calc non Af Amer: >90 mL/min (ref >90)
GLUCOSE: 119 mg/dL — AB (ref 70–99)
Potassium: 5.1 mEq/L (ref 3.7–5.3)
Sodium: 131 mEq/L — ABNORMAL LOW (ref 137–147)

## 2014-02-10 LAB — PHOSPHORUS: PHOSPHORUS: 3.4 mg/dL (ref 2.3–4.6)

## 2014-02-10 LAB — MAGNESIUM: Magnesium: 2.3 mg/dL (ref 1.5–2.5)

## 2014-02-10 NOTE — Progress Notes (Signed)
Asked to talk to daughter April. Long conversation.   Daughter April upset that we were discussing DNR yesterday but today RN told him he is likely going home tomorrow and he is improved on Nasal Cannula. She finds this contradictory and very upset. She thought he was dying and crying all night   Daughter April moved from [place], [place] 13 years ago to take care of him. One year ago moved to live in with him to take care of him. Says this is the focus of her life  She does admit that his quality of life is miserable; stuck to a 12 feet radius due to dyspnea  She feels he over uses benzo  HEr quality of life is in distress due to need to constantly attend to him  Validated her feelings  Explained   -DNAR - does not mean do not treat. Explained what CPR and DNAR means. She verbalized understanding. No commitment   - current AECOPD and refractory dyspnea Rx plan and role of morphine in this. She was initially reluctant about morphine but when I explained to her about the beneficial role and oral nature in dyspnea she was more receptive   - explained that current AECOPD is high mortality situation but does not mean he wont improve   - explained that overall long term outlook poor   - explained that he is not ready for dc home tomorrow    - explained role of palliative care and to continue to engage with them in meetings/conversation   - explained to explore possiblity of "home support". She likes this but fears patient will be resistive   Dr. Brand Males, M.D., [facility].C.P Pulmonary and Critical Care Medicine Staff Physician [place] [facility] Pager: [phone], If no answer or between  15:00h - 7:00h: call [phone]  02/10/2014 4:40 PM

## 2014-02-10 NOTE — Progress Notes (Signed)
PULMONARY / CRITICAL CARE MEDICINE   Name: [name] MRN: [id number] DOB: [birth year]    ADMISSION DATE:  02/05/2014 LOS 5 days   PRIMARY SERVICE: PCCM  CHIEF COMPLAINT:   sob  BRIEF PATIENT DESCRIPTION:  44 yowm quit smoking 23 y pta  with   HTN, and COPD with FEV1 of 26% in 2012. Presented with 3 days history of progressive SOB. At arrival to the ED found to be in respiratory distress and hypoxemic as low as 83% on RA. Started on BiPAP with significant improvement.     LINES / TUBES:    CULTURES: MRSA sreen 5/30 >  neg RVP 5/30 >>>neg Sputum culture 5/30 > not obtained  ANTIBIOTICS:  Levaquin 5/30 >>      SIGNIFICANT EVENTS / STUDIES:  duragesic added 5/30 @ 25 mcg per hour - stopped 02/09/14 Scheduled Po morphine 02/09/14 >>   02/07/14: Much lower wob on bipap this am, a bit shaky and nursing reports beer up to 12 per day pta  02/08/14: appears to be sleeping comfortably   02/09/14: Increased respiratory distress and wob with desats and air hunger and agitation. Rosine Beat with morphine IV and ativan IV and some TLC. During time of acute distress indicated to RNs that he would like to be comfortable and DNR and DNI and no bipap. When MD arrived he said he is not sure but did admit that his life at home is worse than in hospital and is miserable due to class 3-4 dyspnea on account of copd. Also, indicated step daughter should be HCPOA (no formal paper work). He says he is uanble to decide on DNI but wants help with family to make this decision    SUBJECTIVE/OVERNIGHT/INTERVAL HX 02/10/14: More awake after lowering morphine scheduled dose. Appears to be at right balance of wakefulness and dyspnea control. Palliative notes noted  VITAL SIGNS: Temp:  [96.6 F (35.9 C)-98.5 F (36.9 C)] 97.5 F (36.4 C) (06/03 0754) Pulse Rate:  [91-107] 104 (06/03 0952) Resp:  [14-18] 16 (06/03 0952) BP: (104-142)/(80-92) 142/88 mmHg (06/03 0935) SpO2:  [90 %-100 %] 93 % (06/03  0952) FiO2 (%):  [30 %-50 %] 50 % (06/03 0741) Weight:  [48.5 kg (106 lb 14.8 oz)] 48.5 kg (106 lb 14.8 oz) (06/03 0436) HEMODYNAMICS:   VENTILATOR SETTINGS: Vent Mode:  [-] BIPAP FiO2 (%):  [30 %-50 %] 50 % Set Rate:  [10 bmp] 10 bmp INTAKE / OUTPUT: Intake/Output     06/02 0701 - 06/03 0700 06/03 0701 - 06/04 0700   P.O. 120    Total Intake(mL/kg) 120 (2.5)    Urine (mL/kg/hr) 775 (0.7) 50 (0.4)   Total Output 775 50   Net -655 -50          PHYSICAL EXAMINATION:  BP 142/88  Pulse 104  Temp(Src) 97.5 F (36.4 C) (Axillary)  Resp 16  Ht 5\' 1"  (1.549 m)  Wt 48.5 kg (106 lb 14.8 oz)  BMI 20.21 kg/m2  SpO2 93%  General Appearance:  Chronically ill looking  Head:    Normocephalic, without obvious abnormality, atraumatic  Eyes:    PERRL, conjunctiva/corneas clear, EOM's intact, fundi    benign, both eyes       Ears:    Normal TM's and external ear canals, both ears  Nose:   Nares normal, septum midline, mucosa normal, no drainage   or sinus tenderness  Throat:   Lips, mucosa, and tongue normal; teeth and gums normal  Neck:  Supple, symmetrical, trachea midline, no adenopathy;       thyroid:  No enlargement/tenderness/nodules; no carotid   bruit or JVD  Back:     Symmetric, no curvature, ROM normal, no CVA tenderness  Lungs:     Labored, using acc muscles, some grunting but not paradoxicalm no wheeze  Chest wall:  Huge barrell chest  Heart:    Regular rate and rhythm, S1 and S2 normal, no murmur, rub   or gallop  Abdomen:     Soft, non-tender, bowel sounds active all four quadrants,    no masses, no organomegaly  Genitalia:    Normal male without lesion, discharge or tenderness  Rectal:    Normal tone, normal prostate, no masses or tenderness;   guaiac negative stool  Extremities:   Extremities normal, atraumatic, no cyanosis or edema  Pulses:   2+ and symmetric all extremities  Skin:   Skin color, texture, turgor normal, no rashes or lesions  Lymph nodes:    Cervical, supraclavicular, and axillary nodes normal  Neurologic:  RASS 0. CAM-ICU negative for delirium      LABS:  CBC  PULMONARY  Recent Labs Lab 02/06/14 0331  PHART 7.418  PCO2ART 47.6*  PO2ART 71.0*  HCO3 30.8*  TCO2 32  O2SAT 94.0    CBC  Recent Labs Lab 02/06/14 0457 02/09/14 0311 02/10/14 0215  HGB 14.7 15.2 15.2  HCT 41.4 43.5 44.9  WBC 11.1* 19.4* 13.6*  PLT 182 163 144*    COAGULATION No results found for this basename: INR,  in the last 168 hours  CARDIAC    Recent Labs Lab 02/05/14 2340  TROPONINI <0.30   No results found for this basename: PROBNP,  in the last 168 hours   CHEMISTRY  Recent Labs Lab 02/06/14 0457 02/06/14 0723 02/08/14 0319 02/09/14 0311 02/10/14 0215  NA 127* 126* 128* 128* 131*  K 4.3 5.2 5.0 4.9 5.1  CL 84* 83* 85* 87* 86*  CO2 26 29 35* 33* 36*  GLUCOSE 150* 141* 95 92 119*  BUN 8 9 22 23  27*  CREATININE 0.63 0.66 0.65 0.66 0.68  CALCIUM 9.2 9.0 9.1 9.3 9.3  MG  --   --  1.9 2.0 2.3  PHOS  --   --   --  2.9 3.4   Estimated Creatinine Clearance: 57.3 ml/min (by C-G formula based on Cr of 0.68).   LIVER No results found for this basename: AST, ALT, ALKPHOS, BILITOT, PROT, ALBUMIN, INR,  in the last 168 hours   INFECTIOUS No results found for this basename: LATICACIDVEN, PROCALCITON,  in the last 168 hours   ENDOCRINE CBG (last 3)  No results found for this basename: GLUCAP,  in the last 72 hours       IMAGING x48h  No results found.    ASSESSMENT / PLAN:  PULMONARY A: 1) Acute hypoxemic respiratory failure 2) COPD exacerbation 3) FEV1 of 26% on spirometry from 2012;  baseline doe x 25 ft on 02    - improved dyspnea and resp distress and appears at right balance of po  morphine and po ativan. Did not need bipap x 24h  P:   - cotninue scheduled oral morphine @ 2.5mg  q6h + prn IV (dc patch due to need for acute titration)  - continue scheduled benzo ativan 0.5mg  q12h  + prn -    BiPAP dep/ titrate   FiO2 prn: goal pulse ox > 88% only, try to get to nasal cannula -  prednisone - Levaquin per dashboard ; - Duoneb q 4 hrs - Albuterol PRN  CARDIOVASCULAR A:  1) hypertension P:  - clonidine will help with etoh w/d  RENAL A:   1) Hyponatremia - improving  P:   follow BMP   D/c'd hctz 5/31   GASTROINTESTINAL A:   GI prophylaxis:  PPI since on steroids/ aecopd    INFECTIOUS A:   1) COPD exacerbation. No clear infiltrates on chest X ray. P:   - per dashboard   CNS A: Baseline anxiety. Home meds list ativan 0.5mg  q8h prn. Patient unclear what he is on    P  ativan scheduled 0.5mg  q12h Dc fent patch Use scheduled po morphine for dyspnea   GLOBAL 02/08/14: Per RN: Lives with daughter.  Apparently patient wants to make step daughter HCPOA. But these 2 women do not get along. For now full code  02/09/14: HE is confused about his goals of care and wants help with decision making. Will call palliative care. From PCCM stand point, he is hospice eligible and DNI more appropriate.   02/10/14: Palliative care conducting goals of care. Family processing hard information. He has horrible barrell chest, class 4 dyspnea and severe hypoxemia. Hospice is ideal   PLAN  - social work consult  - palliative care consult for symptoms and goals    Keep in SDU for 1 more day  Dr. Brand Males, M.D., [facility].C.P Pulmonary and Critical Care Medicine Staff Physician [place] [facility] Pager: [phone], If no answer or between  15:00h - 7:00h: call [phone]  02/10/2014 9:52 AM

## 2014-02-11 LAB — CBC WITH DIFFERENTIAL/PLATELET
BASOS PCT: 0 % (ref 0–1)
Basophils Absolute: 0 10*3/uL (ref 0.0–0.1)
Eosinophils Absolute: 0.1 10*3/uL (ref 0.0–0.7)
Eosinophils Relative: 1 % (ref 0–5)
HEMATOCRIT: 44.4 % (ref 39.0–52.0)
HEMOGLOBIN: 15.3 g/dL (ref 13.0–17.0)
Lymphocytes Relative: 8 % — ABNORMAL LOW (ref 12–46)
Lymphs Abs: 1 10*3/uL (ref 0.7–4.0)
MCH: 33.1 pg (ref 26.0–34.0)
MCHC: 34.5 g/dL (ref 30.0–36.0)
MCV: 96.1 fL (ref 78.0–100.0)
MONO ABS: 1.1 10*3/uL — AB (ref 0.1–1.0)
MONOS PCT: 8 % (ref 3–12)
NEUTROS ABS: 10.9 10*3/uL — AB (ref 1.7–7.7)
Neutrophils Relative %: 83 % — ABNORMAL HIGH (ref 43–77)
Platelets: 139 10*3/uL — ABNORMAL LOW (ref 150–400)
RBC: 4.62 MIL/uL (ref 4.22–5.81)
RDW: 12.8 % (ref 11.5–15.5)
WBC: 13 10*3/uL — ABNORMAL HIGH (ref 4.0–10.5)

## 2014-02-11 LAB — BASIC METABOLIC PANEL
BUN: 25 mg/dL — ABNORMAL HIGH (ref 6–23)
CHLORIDE: 92 meq/L — AB (ref 96–112)
CO2: 35 meq/L — AB (ref 19–32)
Calcium: 9.4 mg/dL (ref 8.4–10.5)
Creatinine, Ser: 0.62 mg/dL (ref 0.50–1.35)
GFR calc non Af Amer: >90 mL/min (ref >90)
Glucose, Bld: 97 mg/dL (ref 70–99)
POTASSIUM: 4.6 meq/L (ref 3.7–5.3)
Sodium: 134 mEq/L — ABNORMAL LOW (ref 137–147)

## 2014-02-11 LAB — MAGNESIUM: Magnesium: 2.1 mg/dL (ref 1.5–2.5)

## 2014-02-11 LAB — PHOSPHORUS: Phosphorus: 3 mg/dL (ref 2.3–4.6)

## 2014-02-11 MED ORDER — ENSURE COMPLETE PO LIQD
237.0000 mL | Freq: Three times a day (TID) | ORAL | Status: DC
  Administered 2014-02-11 – 2014-02-15 (×15): 237 mL via ORAL

## 2014-02-11 MED ORDER — MORPHINE SULFATE 10 MG/5ML PO SOLN
2.5000 mg | Freq: Two times a day (BID) | ORAL | Status: DC
  Administered 2014-02-11 – 2014-02-12 (×2): 2.5 mg via ORAL
  Filled 2014-02-11 (×2): qty 5

## 2014-02-11 NOTE — Progress Notes (Signed)
Utilization review completed.  

## 2014-02-11 NOTE — Progress Notes (Signed)
NUTRITION FOLLOW UP  DOCUMENTATION CODES  Per approved criteria   -Severe malnutrition in the context of chronic illness   Pt meets criteria for severe MALNUTRITION in the context of chronic illness as evidenced by severe depletion of muscle and subcutaneous fat mass.  Intervention:    Ensure Complete PO QID, each supplement provides 350 kcal and 13 grams of protein  New Nutrition Dx:   Malnutrition related to increased energy expenditure with COPD as evidenced by severe depletion of muscle and subcutaneous fat mass.  Goal:   Intake to meet >90% of estimated nutrition needs. Unmet.  Monitor:   PO intake, labs, weight trend.  Assessment:   Pt with PMH relevant for HTN, and COPD. Presents with 3 days history of progressive SOB. At arrival to the ED found to be in respiratory distress and hypoxemic as low as 83% on room. Started on BiPAP with significant improvement.   RN reports that patient has been eating minimally because he does not want to have a BM in front of the nurses. Has been drinking vanilla Ensure Complete 4 times per day.   Nutrition Focused Physical Exam:  Subcutaneous Fat:  Orbital Region: severe depletion Upper Arm Region: severe depletion Thoracic and Lumbar Region: NA  Muscle:  Temple Region: severe depletion Clavicle Bone Region: severe depletion Clavicle and Acromion Bone Region: severe depletion Scapular Bone Region: NA Dorsal Hand: severe depletion Patellar Region: severe depletion Anterior Thigh Region: severe depletion Posterior Calf Region: severe depletion  Edema: none  Height: Ht Readings from Last 1 Encounters:  02/09/14 5\' 1"  (1.549 m)    Weight Status:   Wt Readings from Last 1 Encounters:  02/11/14 111 lb 5.3 oz (50.5 kg)    Re-estimated needs:  Kcal: 1400-1600  Protein: 70-85g  Fluid: 1.4-1.6 L  Skin: stage 1 pressure ulcer to sacrum  Diet Order: General   Intake/Output Summary (Last 24 hours) at 02/11/14 1443 Last data  filed at 02/11/14 1207  Gross per 24 hour  Intake    240 ml  Output    675 ml  Net   -435 ml    Last BM: PTA   Labs:   Recent Labs Lab 02/09/14 0311 02/10/14 0215 02/11/14 0345  NA 128* 131* 134*  K 4.9 5.1 4.6  CL 87* 86* 92*  CO2 33* 36* 35*  BUN 23 27* 25*  CREATININE 0.66 0.68 0.62  CALCIUM 9.3 9.3 9.4  MG 2.0 2.3 2.1  PHOS 2.9 3.4 3.0  GLUCOSE 92 119* 97    CBG (last 3)  No results found for this basename: GLUCAP,  in the last 72 hours  Scheduled Meds: . antiseptic oral rinse  15 mL Mouth Rinse q12n4p  . cloNIDine  0.1 mg Oral TID  . heparin  5,000 Units Subcutaneous 3 times per day  . ipratropium-albuterol  3 mL Nebulization Q4H  . levofloxacin  500 mg Oral Daily  . LORazepam  0.5 mg Oral Q12H  . morphine  2.5 mg Oral Q12H  . pantoprazole  40 mg Oral BID AC  . predniSONE  40 mg Oral Q breakfast  . senna-docusate  2 tablet Oral BID    Continuous Infusions:  None   Molli Barrows, RD, LDN, CNSC Pager [phone] After Hours Pager [phone]

## 2014-02-11 NOTE — Progress Notes (Signed)
Pt transferred to 5W via bed with all belongings and cell phone at bedside. VSS. Report given to [place], Therapist, sports.

## 2014-02-11 NOTE — Progress Notes (Signed)
PULMONARY / CRITICAL CARE MEDICINE   Name: [name] MRN: [id number] DOB: December 31, 1940    ADMISSION DATE:  02/05/2014 LOS 6 days   PRIMARY SERVICE: PCCM  CHIEF COMPLAINT:   sob  BRIEF PATIENT DESCRIPTION:  18 yowm quit smoking 23 y pta  with   HTN, and COPD with FEV1 of 26% in 2012. Presented with 3 days history of progressive SOB. At arrival to the ED found to be in respiratory distress and hypoxemic as low as 83% on RA. Started on BiPAP with significant improvement.     LINES / TUBES:    CULTURES: MRSA sreen 5/30 >  neg RVP 5/30 >>>neg Sputum culture 5/30 > not obtained  ANTIBIOTICS:  Levaquin 5/30 >> (stop 02/12/14)      SIGNIFICANT EVENTS / STUDIES:  duragesic added 5/30 @ 25 mcg per hour - stopped 02/09/14 Scheduled Po morphine 02/09/14 >>   02/07/14: Much lower wob on bipap this am, a bit shaky and nursing reports beer up to 12 per day pta  02/08/14: appears to be sleeping comfortably   02/09/14: Increased respiratory distress and wob with desats and air hunger and agitation. Rosine Beat with morphine IV and ativan IV and some TLC. During time of acute distress indicated to RNs that he would like to be comfortable and DNR and DNI and no bipap. When MD arrived he said he is not sure but did admit that his life at home is worse than in hospital and is miserable due to class 3-4 dyspnea on account of copd. Also, indicated step daughter should be HCPOA (no formal paper work). He says he is uanble to decide on DNI but wants help with family to make this decision   02/10/14: More awake after lowering morphine scheduled dose. Appears to be at right balance of wakefulness and dyspnea control. Palliative notes noted   SUBJECTIVE/OVERNIGHT/INTERVAL HX 02/11/14: More sleepy. RN reports morphine being held intermittently. Daughter April has several concerns that need to be addressed ongoing basis . Patient described to be living in 12 feet existence - ECOG 4 at baseline du eto copd  VITAL  SIGNS: Temp:  [97.4 F (36.3 C)-98.9 F (37.2 C)] 98.9 F (37.2 C) (06/04 0746) Pulse Rate:  [102-115] 115 (06/04 0746) Resp:  [11-18] 16 (06/04 0746) BP: (122-140)/(80-91) 140/90 mmHg (06/04 0746) SpO2:  [91 %-100 %] 93 % (06/04 0930) FiO2 (%):  [30 %] 30 % (06/04 0333) Weight:  [50.5 kg (111 lb 5.3 oz)] 50.5 kg (111 lb 5.3 oz) (06/04 0500) HEMODYNAMICS:   VENTILATOR SETTINGS: Vent Mode:  [-]  FiO2 (%):  [30 %] 30 % INTAKE / OUTPUT: Intake/Output     06/03 0701 - 06/04 0700 06/04 0701 - 06/05 0700   P.O.  240   Total Intake(mL/kg)  240 (4.8)   Urine (mL/kg/hr) 525 (0.4) 125 (0.5)   Total Output 525 125   Net -525 +115          PHYSICAL EXAMINATION:  BP 140/90  Pulse 115  Temp(Src) 98.9 F (37.2 C) (Oral)  Resp 16  Ht 5\' 1"  (1.549 m)  Wt 50.5 kg (111 lb 5.3 oz)  BMI 21.05 kg/m2  SpO2 93%  General Appearance:  Chronically ill looking  Head:    Normocephalic, without obvious abnormality, atraumatic  Eyes:    PERRL, conjunctiva/corneas clear, EOM's intact, fundi    benign, both eyes       Ears:    Normal TM's and external ear canals, both ears  Nose:   Nares normal, septum midline, mucosa normal, no drainage   or sinus tenderness  Throat:   Lips, mucosa, and tongue normal; teeth and gums normal  Neck:   Supple, symmetrical, trachea midline, no adenopathy;       thyroid:  No enlargement/tenderness/nodules; no carotid   bruit or JVD  Back:     Symmetric, no curvature, ROM normal, no CVA tenderness  Lungs:     Labored, using acc muscles, some grunting but not paradoxicalm no wheeze  Chest wall:  Huge barrell chest  Heart:    Regular rate and rhythm, S1 and S2 normal, no murmur, rub   or gallop  Abdomen:     Soft, non-tender, bowel sounds active all four quadrants,    no masses, no organomegaly  Genitalia:    Normal male without lesion, discharge or tenderness  Rectal:    Normal tone, normal prostate, no masses or tenderness;   guaiac negative stool  Extremities:    Extremities normal, atraumatic, no cyanosis or edema  Pulses:   2+ and symmetric all extremities  Skin:   Skin color, texture, turgor normal, no rashes or lesions  Lymph nodes:   Cervical, supraclavicular, and axillary nodes normal  Neurologic:  RASS -2.       LABS:  CBC  PULMONARY  Recent Labs Lab 02/06/14 0331  PHART 7.418  PCO2ART 47.6*  PO2ART 71.0*  HCO3 30.8*  TCO2 32  O2SAT 94.0    CBC  Recent Labs Lab 02/09/14 0311 02/10/14 0215 02/11/14 0345  HGB 15.2 15.2 15.3  HCT 43.5 44.9 44.4  WBC 19.4* 13.6* 13.0*  PLT 163 144* 139*    COAGULATION No results found for this basename: INR,  in the last 168 hours  CARDIAC    Recent Labs Lab 02/05/14 2340  TROPONINI <0.30   No results found for this basename: PROBNP,  in the last 168 hours   CHEMISTRY  Recent Labs Lab 02/06/14 0723 02/08/14 0319 02/09/14 0311 02/10/14 0215 02/11/14 0345  NA 126* 128* 128* 131* 134*  K 5.2 5.0 4.9 5.1 4.6  CL 83* 85* 87* 86* 92*  CO2 29 35* 33* 36* 35*  GLUCOSE 141* 95 92 119* 97  BUN 9 22 23  27* 25*  CREATININE 0.66 0.65 0.66 0.68 0.62  CALCIUM 9.0 9.1 9.3 9.3 9.4  MG  --  1.9 2.0 2.3 2.1  PHOS  --   --  2.9 3.4 3.0   Estimated Creatinine Clearance: 59.6 ml/min (by C-G formula based on Cr of 0.62).   LIVER No results found for this basename: AST, ALT, ALKPHOS, BILITOT, PROT, ALBUMIN, INR,  in the last 168 hours   INFECTIOUS No results found for this basename: LATICACIDVEN, PROCALCITON,  in the last 168 hours   ENDOCRINE CBG (last 3)  No results found for this basename: GLUCAP,  in the last 72 hours       IMAGING x48h  No results found.    ASSESSMENT / PLAN:  PULMONARY A: 1) Acute hypoxemic respiratory failure 2) COPD exacerbation 3) FEV1 of 26% on spirometry from 2012;  baseline doe x 25 ft on 02    - improved dyspnea and resp distress. Current dose  po  morphine 2.5mg  q6h might be too much. Ativan 0.5mg  bid might be adequate for  this tremors. Did not need bipap x 48h  P:   - cotninue scheduled oral morphine but reduce it  @ 2.5mg  q12h + prn IV   -  continue scheduled benzo ativan 0.5mg  q12h  + prn -   DC  BiPAP   - / titrate   FiO2 prn: goal pulse ox > 88% only, try to get to nasal cannula (curretly on 3L [place]) -  prednisone - Levaquin per dashboard ; dc 02/12/14 - Duoneb q 4 hrs - Albuterol PRN  CARDIOVASCULAR A:  1) hypertension P:  - clonidine will help with etoh w/d  RENAL A:   1) Hyponatremia - improving  P:   follow BMP   D/c'd hctz 5/31   GASTROINTESTINAL A:   GI prophylaxis:  PPI since on steroids/ aecopd    INFECTIOUS A:   1) COPD exacerbation. No clear infiltrates on chest X ray. P:   - per dashboard   CNS A: Baseline anxiety. Home meds list ativan 0.5mg  q8h prn. Patient unclear what he is on    P  ativan scheduled 0.5mg  q12h Dc fent patch Use scheduled po morphine for dyspnea   GLOBAL 02/08/14: Per RN: Lives with daughter.  Apparently patient wants to make step daughter HCPOA. But these 2 women do not get along. For now full code  02/09/14: HE is confused about his goals of care and wants help with decision making. Will call palliative care. From PCCM stand point, he is hospice eligible and DNI more appropriate.   02/10/14: Palliative care conducting goals of care. Family processing hard information. He has horrible barrell chest, class 4 dyspnea and severe hypoxemia. Hospice is ideal   02/11/14: Daughter April has several concerns that need to be addressed. She is finding it hard to come to terms with his prognosis and illness. DId not understand DNR completely. Has concerns dad over using benzo at home.   PLAN  - social work consult  - palliative care consult for symptoms and goals    Move to floor    Dr. Brand Males, M.D., [facility].C.P Pulmonary and Critical Care Medicine Staff Physician [place] [facility] Pager: [phone], If no  answer or between  15:00h - 7:00h: call [phone]  02/11/2014 11:31 AM

## 2014-02-11 NOTE — Progress Notes (Signed)
NURSING PROGRESS NOTE  [name] [id number] Transfer Data: 02/11/2014 3:58 PM Attending Provider: Tanda Rockers, MD [id number],GUY A, MD Code Status: FULL   [name] is a 73 y.o. male patient transferred from  [place]  -No acute distress noted.  -No complaints of shortness of breath.  -No complaints of chest pain.   Blood pressure 127/84, pulse 115, temperature 98.1 F (36.7 C), temperature source Oral, resp. rate 18, height 5\' 1"  (1.549 m), weight 49.714 kg (109 lb 9.6 oz), SpO2 94.00%.   IV Fluids:  IV in place, occlusive dsg intact without redness, IV cath hand left, condition patent and no redness none.   Allergies:  NKA Review of patient's allergies indicates no known allergies.  Past Medical History:   has a past medical history of HTN (hypertension); Asthma; Melanocarcinoma; COPD (chronic obstructive pulmonary disease); Tumor cells, malignant; and Pneumonia (05/10/11).  Past Surgical History:   has past surgical history that includes Hemorrhoid surgery and large intestinal surgery (02/16/2011).  Skin: intact   Will continue to evaluate and treat per MD orders.  Marguerite Olea, RN

## 2014-02-12 DIAGNOSIS — J441 Chronic obstructive pulmonary disease with (acute) exacerbation: Secondary | ICD-10-CM

## 2014-02-12 DIAGNOSIS — E43 Unspecified severe protein-calorie malnutrition: Secondary | ICD-10-CM | POA: Diagnosis present

## 2014-02-12 DIAGNOSIS — J982 Interstitial emphysema: Secondary | ICD-10-CM

## 2014-02-12 LAB — CBC WITH DIFFERENTIAL/PLATELET
Basophils Absolute: 0 10*3/uL (ref 0.0–0.1)
Basophils Relative: 0 % (ref 0–1)
EOS ABS: 0 10*3/uL (ref 0.0–0.7)
Eosinophils Relative: 0 % (ref 0–5)
HCT: 44 % (ref 39.0–52.0)
HEMOGLOBIN: 14.9 g/dL (ref 13.0–17.0)
Lymphocytes Relative: 4 % — ABNORMAL LOW (ref 12–46)
Lymphs Abs: 0.6 10*3/uL — ABNORMAL LOW (ref 0.7–4.0)
MCH: 32.6 pg (ref 26.0–34.0)
MCHC: 33.9 g/dL (ref 30.0–36.0)
MCV: 96.3 fL (ref 78.0–100.0)
MONO ABS: 1.2 10*3/uL — AB (ref 0.1–1.0)
MONOS PCT: 9 % (ref 3–12)
Neutro Abs: 11.7 10*3/uL — ABNORMAL HIGH (ref 1.7–7.7)
Neutrophils Relative %: 87 % — ABNORMAL HIGH (ref 43–77)
Platelets: 131 10*3/uL — ABNORMAL LOW (ref 150–400)
RBC: 4.57 MIL/uL (ref 4.22–5.81)
RDW: 13.1 % (ref 11.5–15.5)
WBC: 13.4 10*3/uL — ABNORMAL HIGH (ref 4.0–10.5)

## 2014-02-12 LAB — MAGNESIUM: Magnesium: 1.9 mg/dL (ref 1.5–2.5)

## 2014-02-12 LAB — BASIC METABOLIC PANEL
BUN: 27 mg/dL — ABNORMAL HIGH (ref 6–23)
CALCIUM: 9.4 mg/dL (ref 8.4–10.5)
CO2: 34 meq/L — AB (ref 19–32)
Chloride: 91 mEq/L — ABNORMAL LOW (ref 96–112)
Creatinine, Ser: 0.66 mg/dL (ref 0.50–1.35)
GFR calc Af Amer: >90 mL/min (ref >90)
GFR calc non Af Amer: >90 mL/min (ref >90)
GLUCOSE: 110 mg/dL — AB (ref 70–99)
Potassium: 5.1 mEq/L (ref 3.7–5.3)
Sodium: 134 mEq/L — ABNORMAL LOW (ref 137–147)

## 2014-02-12 LAB — PHOSPHORUS: Phosphorus: 4.1 mg/dL (ref 2.3–4.6)

## 2014-02-12 MED ORDER — LORAZEPAM 2 MG/ML IJ SOLN: 1.0000 mg | INTRAMUSCULAR | Status: DC | PRN

## 2014-02-12 MED ORDER — MAGIC MOUTHWASH
10.0000 mL | Freq: Three times a day (TID) | ORAL | Status: DC
  Administered 2014-02-12 – 2014-02-15 (×8): 10 mL via ORAL
  Filled 2014-02-12 (×11): qty 10

## 2014-02-12 MED ORDER — MORPHINE SULFATE (CONCENTRATE) 10 MG /0.5 ML PO SOLN
5.0000 mg | Freq: Two times a day (BID) | ORAL | Status: DC
  Administered 2014-02-12 – 2014-02-15 (×6): 5 mg via ORAL
  Filled 2014-02-12 (×6): qty 0.5

## 2014-02-12 MED ORDER — LORAZEPAM 1 MG PO TABS
1.0000 mg | ORAL_TABLET | Freq: Two times a day (BID) | ORAL | Status: DC
  Administered 2014-02-12 – 2014-02-15 (×6): 1 mg via ORAL
  Filled 2014-02-12 (×6): qty 1

## 2014-02-12 MED ORDER — CHLORHEXIDINE GLUCONATE 0.12 % MT SOLN
15.0000 mL | Freq: Two times a day (BID) | OROMUCOSAL | Status: DC
  Administered 2014-02-12 – 2014-02-15 (×5): 15 mL via OROMUCOSAL
  Filled 2014-02-12 (×8): qty 15

## 2014-02-12 MED ORDER — HEPARIN SODIUM (PORCINE) 5000 UNIT/ML IJ SOLN
5000.0000 [IU] | Freq: Three times a day (TID) | INTRAMUSCULAR | Status: DC
  Administered 2014-02-12 – 2014-02-15 (×9): 5000 [IU] via SUBCUTANEOUS
  Filled 2014-02-12 (×11): qty 1

## 2014-02-12 MED ORDER — AZITHROMYCIN 250 MG PO TABS
250.0000 mg | ORAL_TABLET | Freq: Every day | ORAL | Status: DC
  Administered 2014-02-12 – 2014-02-15 (×4): 250 mg via ORAL
  Filled 2014-02-12 (×4): qty 1

## 2014-02-12 MED ORDER — CLONIDINE HCL 0.3 MG/24HR TD PTWK
0.3000 mg | MEDICATED_PATCH | TRANSDERMAL | Status: DC
  Administered 2014-02-12: 0.3 mg via TRANSDERMAL
  Filled 2014-02-12: qty 1

## 2014-02-12 MED ORDER — MORPHINE SULFATE (CONCENTRATE) 10 MG /0.5 ML PO SOLN: 5.0000 mg | ORAL | Status: DC | PRN

## 2014-02-12 MED ORDER — MAGNESIUM SULFATE 40 MG/ML IJ SOLN
2.0000 g | Freq: Once | INTRAMUSCULAR | Status: AC
  Administered 2014-02-12: 2 g via INTRAVENOUS
  Filled 2014-02-12: qty 50
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
## 2014-02-12 NOTE — Care Management Note (Signed)
    Page 1 of 2   02/15/2014     5:05:06 PM CARE MANAGEMENT NOTE 02/15/2014  Patient:  [name], [name]   Account Number:  [id number]  Date Initiated:  02/08/2014  Documentation initiated by:  Marvetta Gibbons  Subjective/Objective Assessment:   Pt admitted with acute resp. distress- COPD     Action/Plan:   PTA pt lived at home with daughter - NCM to follow for d/c needs   Anticipated DC Date:  02/15/2014   Anticipated DC Plan:  HOME W HOSPICE CARE  In-house referral  Clinical Social Worker      DC Forensic scientist  CM consult      PAC Choice  HOSPICE   Choice offered to / List presented to:  C-4 Adult Children   DME arranged  [place] arranged  HH-1 RN      [facility] agency  [facility]   Status of service:  Completed, signed off Medicare Important Message given?  YES (If response is "NO", the following Medicare IM given date fields will be blank) Date Medicare IM given:  02/12/2014 Date Additional Medicare IM given:  02/15/2014  Discharge Disposition:  [place]  Per UR Regulation:  Reviewed for med. necessity/level of care/duration of stay  If discussed at [place] of Stay Meetings, dates discussed:   02/11/2014    Comments:  02/15/14 [place], BSN [phone] patient  dc to home with [facility], daughter , April chose [facility], referral made. [facility] is responsible for getting the dme to patient's home, per April, the hospital bed does not have to be there for patient to be dc.  DME needed ( hospital bed, over bed table, 3 n 1) Patient has home oxygen with [facility] and a nebulizer machine which will be transitioned out by [facility] to their vendor.  02/10/14- 1400- Marvetta Gibbons RN, BSN [phone] Weaning pt from bipap- PC following for [place]- pt at this time remains full code

## 2014-02-12 NOTE — Progress Notes (Signed)
PULMONARY / CRITICAL CARE MEDICINE   Name: [name] MRN: [id number] DOB: [birth year]    ADMISSION DATE:  02/05/2014 LOS 7 days   PRIMARY SERVICE: PCCM CHIEF COMPLAINT:  SOB  BRIEF PATIENT DESCRIPTION:  62 yowm quit smoking 45 y pta  with a PMH of HTN, and COPD with FEV1 of 26% in 2012. Presented 5/29 with 3 days history of progressive SOB. At arrival to the ED found to be in respiratory distress and hypoxemic as low as 83% on RA. Started on BiPAP with significant improvement.     CULTURES: MRSA sreen 5/30 >  neg RVP 5/30 >>>neg Sputum culture 5/30 > not obtained  ANTIBIOTICS: Levaquin 5/30 >> (stop 02/12/14)    SIGNIFICANT EVENTS / STUDIES:  5/30 - Duragesic added @ 25 mcg per hour - stopped 02/09/14 5/31 - Much lower wob on bipap this am, a bit shaky and nursing reports beer up to 12 per day pta 6/02 - Scheduled Po morphine for increased respiratory distress and wob with desats and air hunger and agitation. Settled with morphine IV and ativan IV. During time of acute distress indicated to RNs that he would like to be comfortable and DNR and DNI and no bipap. When MD arrived he said he is not sure but did admit that his life at home is worse than in hospital and is miserable due to dyspnea.   Also, indicated step daughter should be HCPOA (no formal paper work). He says he is uanble to decide on DNI but wants help with family to make this decision 6/03 - More awake after lowering morphine scheduled dose. Appears to be at right balance of wakefulness and dyspnea control. Palliative notes noted 6/04 - More sleepy. RN reports morphine being held intermittently. Daughter April has several concerns that need to be addressed ongoing basis . Patient described to be living in 12 feet existence - ECOG 4 at baseline due to copd  SUBJECTIVE:  Pt more alert this am.  Denies significant SOB, minimal cough   Staf MD note: refused breakfast   VITAL SIGNS: Temp:  [97.6 F (36.4 C)-98.5 F (36.9  C)] 97.8 F (36.6 C) (06/05 0348) Pulse Rate:  [107-120] 107 (06/05 0348) Resp:  [18] 18 (06/05 0348) BP: (127-137)/(81-90) 137/90 mmHg (06/05 0348) SpO2:  [91 %-95 %] 94 % (06/05 0425) Weight:  [109 lb 4.8 oz (49.578 kg)-109 lb 9.6 oz (49.714 kg)] 109 lb 4.8 oz (49.578 kg) (06/05 0612)  INTAKE / OUTPUT: Intake/Output     06/04 0701 - 06/05 0700 06/05 0701 - 06/06 0700   P.O. 240    Total Intake(mL/kg) 240 (4.8)    Urine (mL/kg/hr) 475 (0.4)    Total Output 475     Net -235            PHYSICAL EXAMINATION: BP 137/90  Pulse 107  Temp(Src) 97.8 F (36.6 C) (Oral)  Resp 18  Ht 5\' 1"  (1.549 m)  Wt 109 lb 4.8 oz (49.578 kg)  BMI 20.66 kg/m2  SpO2 94%  General: frail adult male in NAD Neuro: Asleep upon entering, arouses to voice, MAE, AAOx4 CV: s1s2 rrr, no m/r/g PULM: resp's even/non-labored, prolonged exp phase, lungs bilaterally distant but clear GI: flat, soft, bsx4 active Extremities: warm, dry, no edema   LABS:  CBC  PULMONARY  Recent Labs Lab 02/06/14 0331  PHART 7.418  PCO2ART 47.6*  PO2ART 71.0*  HCO3 30.8*  TCO2 32  O2SAT 94.0   CBC  Recent Labs Lab  02/10/14 0215 02/11/14 0345 02/12/14 0634  HGB 15.2 15.3 14.9  HCT 44.9 44.4 44.0  WBC 13.6* 13.0* 13.4*  PLT 144* 139* 131*   CHEMISTRY  Recent Labs Lab 02/08/14 0319 02/09/14 0311 02/10/14 0215 02/11/14 0345 02/12/14 0634  NA 128* 128* 131* 134* 134*  K 5.0 4.9 5.1 4.6 5.1  CL 85* 87* 86* 92* 91*  CO2 35* 33* 36* 35* 34*  GLUCOSE 95 92 119* 97 110*  BUN 22 23 27* 25* 27*  CREATININE 0.65 0.66 0.68 0.62 0.66  CALCIUM 9.1 9.3 9.3 9.4 9.4  MG 1.9 2.0 2.3 2.1 1.9  PHOS  --  2.9 3.4 3.0 4.1   Estimated Creatinine Clearance: 58.6 ml/min (by C-G formula based on Cr of 0.66).    IMAGING x48h  No results found.    ASSESSMENT / PLAN:  PULMONARY A: Acute hypoxemic respiratory failure - improved dyspnea and resp distress. Current dose  po  morphine 2.5mg  q6h might be too much.  Ativan 0.5mg  bid might be adequate for this tremors. Off bipap COPD exacerbation - FEV1 of 26% on spirometry from 2012;  baseline doe x 25 ft on 02   P:   - cotninue scheduled oral morphine but at reduced rate of  @ 2.5mg  q12h + prn IV  - continue scheduled benzo ativan 0.5mg  q12h  + prn - DC  BiPAP  - titrate FiO2 prn: goal pulse ox > 88% only, try to get to nasal cannula (curretly on 3L [place]) - prednisone with slow taper - Levaquin per dashboard ; dc 02/12/14 - Duoneb q 4 hrs - Albuterol PRN  CARDIOVASCULAR A:  Hypertension P:  - clonidine will help with etoh w/d  RENAL A:   Hyponatremia - improving P:   -follow BMP   -D/c'd hctz 5/31   GASTROINTESTINAL A:   Protein Calorie Malnutrition P: - GI prophylaxis:  PPI since on steroids/ aecopd - nutrition consult  - regular diet as tolerated + feeding supplement   INFECTIOUS A:   COPD exacerbation. No clear infiltrates on chest X ray. P:   - abx as above  CNS A:  Baseline anxiety - Home meds list ativan 0.5mg  q8h prn. Patient unclear what he is on  P: - ativan scheduled 0.5mg  q12h - Scheduled po morphine 2.5 mg Q 12 for dyspnea   GLOBAL 02/08/14: Per RN: Lives with daughter.  Apparently patient wants to make step daughter HCPOA. But these 2 women do not get along. For now full code  02/09/14: HE is confused about his goals of care and wants help with decision making. Will call palliative care. From PCCM stand point, he is hospice eligible and DNI more appropriate.   02/10/14: Palliative care conducting goals of care. Family processing hard information. He has horrible barrell chest, class 4 dyspnea and severe hypoxemia. Hospice is ideal   02/11/14: Daughter April has several concerns that need to be addressed. She is finding it hard to come to terms with his prognosis and illness. Did not understand DNR completely. Has concerns dad over using benzo at home.   PLAN  - social work consult - palliative care consult for symptoms  and goals    Noe Gens, NP-C [place] [facility] Pgr: [phone] or [phone]   STAFF NOTE  - he has improved since admission but will not improve from here on. His current ECOG is 4. IT was 3-4 at home anyways; "12 feet radius" existence per daughter. His 6 month prognosis  is very poor. Daughter has trouble accepting this and flips out when code status or goals decision approached. He would be better off with better quality of life with home hospice even if he is full code. Ihave left message with Dr Hilma Favors of palliative care to do re-goals ASAP. If they wont accept hospice, he will have to go home in this condition   Dr. Brand Males, M.D., [facility].C.P Pulmonary and Critical Care Medicine Staff Physician [place] [facility] Pager: [phone], If no answer or between  15:00h - 7:00h: call [phone]  02/12/2014 12:33 PM

## 2014-02-13 DIAGNOSIS — E43 Unspecified severe protein-calorie malnutrition: Secondary | ICD-10-CM

## 2014-02-13 NOTE — Progress Notes (Signed)
02/13/14 Patient is not able to swallow as well as yesterday, refused some of his pills and mouth rinses.

## 2014-02-13 NOTE — Progress Notes (Signed)
PULMONARY / CRITICAL CARE MEDICINE   Name: KAINOA SWOBODA MRN: [id number] DOB: May 24, 1941    ADMISSION DATE:  02/05/2014 LOS 8 days   PRIMARY SERVICE: PCCM CHIEF COMPLAINT:  SOB  BRIEF PATIENT DESCRIPTION:  49 yowm quit smoking 88 y pta  with a PMH of HTN, and COPD with FEV1 of 26% in 2012. Presented 5/29 with 3 days history of progressive SOB. At arrival to the ED found to be in respiratory distress and hypoxemic as low as 83% on RA. Started on BiPAP with significant improvement.     CULTURES: MRSA sreen 5/30 >  neg RVP 5/30 >>>neg Sputum culture 5/30 > not obtained  ANTIBIOTICS: Levaquin 5/30 >> (stop 02/12/14)    SIGNIFICANT EVENTS / STUDIES:  5/30 - Duragesic added @ 25 mcg per hour - stopped 02/09/14 5/31 - Much lower wob on bipap this am, a bit shaky and nursing reports beer up to 12 per day pta 6/02 - Scheduled Po morphine for increased respiratory distress and wob with desats and air hunger and agitation. Settled with morphine IV and ativan IV. During time of acute distress indicated to RNs that he would like to be comfortable and DNR and DNI and no bipap. When MD arrived he said he is not sure but did admit that his life at home is worse than in hospital and is miserable due to dyspnea.   Also, indicated step daughter should be HCPOA (no formal paper work). He says he is uanble to decide on DNI but wants help with family to make this decision 6/03 - More awake after lowering morphine scheduled dose. Appears to be at right balance of wakefulness and dyspnea control. Palliative notes noted 6/04 - More sleepy. RN reports morphine being held intermittently. Daughter April has several concerns that need to be addressed ongoing basis . Patient described to be living in 12 feet existence - ECOG 4 at baseline due to copd 6/5- Palliative care note  SUBJECTIVE:  Pt  alert this am.  Denies significant SOB, minimal cough. Only wants someone to scratch his back   VITAL SIGNS: Temp:  [97.3 F  (36.3 C)-97.8 F (36.6 C)] 97.3 F (36.3 C) (06/06 0458) Pulse Rate:  [97-102] 98 (06/06 0458) Resp:  [18-20] 20 (06/06 0458) BP: (116-121)/(76-83) 118/76 mmHg (06/06 0458) SpO2:  [85 %-98 %] 93 % (06/06 0458)  INTAKE / OUTPUT: Intake/Output     06/05 0701 - 06/06 0700 06/06 0701 - 06/07 0700   P.O.     Total Intake(mL/kg)     Urine (mL/kg/hr) 125 (0.1)    Total Output 125     Net -125            PHYSICAL EXAMINATION: BP 118/76  Pulse 98  Temp(Src) 97.3 F (36.3 C) (Oral)  Resp 20  Ht 5\' 1"  (1.549 m)  Wt 49.578 kg (109 lb 4.8 oz)  BMI 20.66 kg/m2  SpO2 93%  General: frail adult male in NAD, needed assistance to sit Neuro: Alert, attentive MAE, AAOx4 CV: s1s2 rrr, no m/r/g PULM: resp's even/non-labored, prolonged exp phase, lungs bilaterally distant but clear GI: flat, soft, bsx4 active Extremities: warm, dry, no edema Skin- no rash  LABS:  CBC  PULMONARY No results found for this basename: PHART, PCO2, PCO2ART, PO2, PO2ART, HCO3, TCO2, O2SAT,  in the last 168 hours CBC  Recent Labs Lab 02/10/14 0215 02/11/14 0345 02/12/14 0634  HGB 15.2 15.3 14.9  HCT 44.9 44.4 44.0  WBC 13.6* 13.0* 13.4*  PLT 144* 139* 131*   CHEMISTRY  Recent Labs Lab 02/08/14 0319 02/09/14 0311 02/10/14 0215 02/11/14 0345 02/12/14 0634  NA 128* 128* 131* 134* 134*  K 5.0 4.9 5.1 4.6 5.1  CL 85* 87* 86* 92* 91*  CO2 35* 33* 36* 35* 34*  GLUCOSE 95 92 119* 97 110*  BUN 22 23 27* 25* 27*  CREATININE 0.65 0.66 0.68 0.62 0.66  CALCIUM 9.1 9.3 9.3 9.4 9.4  MG 1.9 2.0 2.3 2.1 1.9  PHOS  --  2.9 3.4 3.0 4.1   Estimated Creatinine Clearance: 58.6 ml/min (by C-G formula based on Cr of 0.66).    IMAGING x48h  No results found.    ASSESSMENT / PLAN:  PULMONARY A: Acute hypoxemic respiratory failure - improved dyspnea and resp distress.. Ativan 0.5mg  bid might be adequate for this tremors. Off bipap COPD exacerbation - FEV1 of 26% on spirometry from 2012;  baseline doe  x 25 ft on 02   P:   - cotninue scheduled oral morphine at reduced rate of  @ 2.5mg  q12h + prn IV  - continue scheduled benzo ativan 0.5mg  q12h  + prn - DC  BiPAP  - titrate FiO2 prn: goal pulse ox > 88% only, try to get to nasal cannula (curretly on 3L [place]) - prednisone with slow taper - Levaquin per dashboard ; dc 02/12/14 - Duoneb q 4 hrs - Albuterol PRN  CARDIOVASCULAR A:  Hypertension P:  - clonidine will help with etoh w/d  RENAL A:   Hyponatremia - improving P:   -follow BMP   -D/c'd hctz 5/31   GASTROINTESTINAL A:   Protein Calorie Malnutrition P: - GI prophylaxis:  PPI since on steroids/ aecopd - nutrition consult  - regular diet as tolerated + feeding supplement   INFECTIOUS A:   COPD exacerbation. No clear infiltrates on chest X ray. P:   - abx as above  CNS A:  Baseline anxiety - Home meds list ativan 0.5mg  q8h prn. Patient unclear what he is on  P: - ativan scheduled 0.5mg  q12h - Scheduled po morphine 2.5 mg Q 12 for dyspnea   GLOBAL 02/08/14: Per RN: Lives with daughter.  Apparently patient wants to make step daughter HCPOA. But these 2 women do not get along. For now full code  02/09/14: HE is confused about his goals of care and wants help with decision making. Will call palliative care. From PCCM stand point, he is hospice eligible and DNI more appropriate.   02/10/14: Palliative care conducting goals of care. Family processing hard information. He has horrible barrell chest, class 4 dyspnea and severe hypoxemia. Hospice is ideal   02/11/14: Daughter April has several concerns that need to be addressed. She is finding it hard to come to terms with his prognosis and illness. Did not understand DNR completely. Has concerns dad over using benzo at home.   PLAN  - social work consult - palliative care consult for symptoms and goals    STAFF NOTE retained 6/6  - he has improved since admission but will not improve from here on. His current ECOG is 4. IT  was 3-4 at home anyways; "12 feet radius" existence per daughter. His 6 month prognosis is very poor. Daughter has trouble accepting this and flips out when code status or goals decision approached. He would be better off with better quality of life with home hospice even if he is full code.. If they wont accept hospice, he will have to go  home in this condition   CD Annamaria Boots, MD [facility] Pager: [phone], If no answer or between  15:00h - 7:00h: call [phone]  02/13/2014 8:28 AM

## 2014-02-14 MED ORDER — PREDNISONE 20 MG PO TABS
40.0000 mg | ORAL_TABLET | Freq: Every day | ORAL | Status: DC
  Administered 2014-02-14 – 2014-02-15 (×2): 40 mg via ORAL
  Filled 2014-02-14 (×3): qty 2

## 2014-02-14 MED ORDER — FLUCONAZOLE IN SODIUM CHLORIDE 200-0.9 MG/100ML-% IV SOLN
200.0000 mg | Freq: Once | INTRAVENOUS | Status: AC
  Administered 2014-02-14: 200 mg via INTRAVENOUS
  Filled 2014-02-14: qty 100

## 2014-02-14 NOTE — Progress Notes (Signed)
PULMONARY / CRITICAL CARE MEDICINE   Name: [name] MRN: [id number] DOB: [birth year]    ADMISSION DATE:  02/05/2014 LOS 9 days   PRIMARY SERVICE: PCCM CHIEF COMPLAINT:  SOB  BRIEF PATIENT DESCRIPTION:  41 yowm quit smoking 1 y pta  with a PMH of HTN, and COPD with FEV1 of 26% in 2012. Presented 5/29 with 3 days history of progressive SOB. At arrival to the ED found to be in respiratory distress and hypoxemic as low as 83% on RA. Started on BiPAP with significant improvement.     CULTURES: MRSA sreen 5/30 >  neg RVP 5/30 >>>neg Sputum culture 5/30 > not obtained  ANTIBIOTICS: Levaquin 5/30 >> (stop 02/12/14)    SIGNIFICANT EVENTS / STUDIES:  5/30 - Duragesic added @ 25 mcg per hour - stopped 02/09/14 5/31 - Much lower wob on bipap this am, a bit shaky and nursing reports beer up to 12 per day pta 6/02 - Scheduled Po morphine for increased respiratory distress and wob with desats and air hunger and agitation. Settled with morphine IV and ativan IV. During time of acute distress indicated to RNs that he would like to be comfortable and DNR and DNI and no bipap. When MD arrived he said he is not sure but did admit that his life at home is worse than in hospital and is miserable due to dyspnea.   Also, indicated step daughter should be HCPOA (no formal paper work). He says he is uanble to decide on DNI but wants help with family to make this decision 6/03 - More awake after lowering morphine scheduled dose. Appears to be at right balance of wakefulness and dyspnea control. Palliative notes noted 6/04 - More sleepy. RN reports morphine being held intermittently. Daughter [name] has several concerns that need to be addressed ongoing basis . Patient described to be living in 12 feet existence - ECOG 4 at baseline due to copd 6/5- Palliative care note  SUBJECTIVE:  Nurse in room reports difficulty swallowing pills yesterday- needed to spread them out over time. No acute concern.   VITAL  SIGNS: Temp:  [97.7 F (36.5 C)-97.8 F (36.6 C)] 97.7 F (36.5 C) (06/07 0526) Pulse Rate:  [95-108] 108 (06/07 0526) Resp:  [18-20] 20 (06/07 0526) BP: (117-169)/(79-101) 117/79 mmHg (06/07 0526) SpO2:  [87 %-97 %] 95 % (06/07 0730)  INTAKE / OUTPUT: Intake/Output     06/06 0701 - 06/07 0700 06/07 0701 - 06/08 0700   P.O. 218    Total Intake(mL/kg) 218 (4.4)    Urine (mL/kg/hr) 800 (0.7)    Total Output 800     Net -582            PHYSICAL EXAMINATION: BP 117/79  Pulse 108  Temp(Src) 97.7 F (36.5 C) (Oral)  Resp 20  Ht 5\' 1"  (1.549 m)  Wt 49.578 kg (109 lb 4.8 oz)  BMI 20.66 kg/m2  SpO2 95%  General: frail adult male in NAD, DOE turning in bed, reaching for tray Neuro: Alert, attentive MAE, AAOx4 CV: s1s2 rrr, no m/r/g, distant PULM: resp's even/non-labored, prolonged exp phase, lungs bilaterally distant but clear GI: flat, soft, bsx4 active Extremities: warm, dry, no edema Skin- no rash  LABS:  CBC  PULMONARY No results found for this basename: PHART, PCO2, PCO2ART, PO2, PO2ART, HCO3, TCO2, O2SAT,  in the last 168 hours CBC  Recent Labs Lab 02/10/14 0215 02/11/14 0345 02/12/14 0634  HGB 15.2 15.3 14.9  HCT 44.9 44.4 44.0  WBC 13.6* 13.0* 13.4*  PLT 144* 139* 131*   CHEMISTRY  Recent Labs Lab 02/08/14 0319 02/09/14 0311 02/10/14 0215 02/11/14 0345 02/12/14 0634  NA 128* 128* 131* 134* 134*  K 5.0 4.9 5.1 4.6 5.1  CL 85* 87* 86* 92* 91*  CO2 35* 33* 36* 35* 34*  GLUCOSE 95 92 119* 97 110*  BUN 22 23 27* 25* 27*  CREATININE 0.65 0.66 0.68 0.62 0.66  CALCIUM 9.1 9.3 9.3 9.4 9.4  MG 1.9 2.0 2.3 2.1 1.9  PHOS  --  2.9 3.4 3.0 4.1   Estimated Creatinine Clearance: 58.6 ml/min (by C-G formula based on Cr of 0.66).    IMAGING x48h  No results found.    ASSESSMENT / PLAN:  PULMONARY A: Acute hypoxemic respiratory failure - improved dyspnea and resp distress.. Ativan 0.5mg  bid might be adequate for this tremor. Off bipap COPD  exacerbation - FEV1 of 26% on spirometry from 2012;  baseline doe x 25 ft on 02   P:   - cotninue scheduled oral morphine at reduced rate of  @ 2.5mg  q12h + prn IV  - continue scheduled benzo ativan 0.5mg  q12h  + prn - DC  BiPAP  - titrate FiO2 prn: goal pulse ox > 88% only, try to get to nasal cannula (curretly on 3L [place]) - prednisone with slow taper - Levaquin per dashboard ; dc 02/12/14 - Duoneb q 4 hrs - Albuterol PRN  CARDIOVASCULAR A:  Hypertension P:  - clonidine will help with etoh w/d  RENAL A:   Hyponatremia - improving P:   -follow BMP   -D/c'd hctz 5/31   GASTROINTESTINAL A:   Protein Calorie Malnutrition P: - GI prophylaxis:  PPI since on steroids/ aecopd - nutrition consult  - regular diet as tolerated + feeding supplement   INFECTIOUS A:   COPD exacerbation. No clear infiltrates on chest X ray. P:   - abx as above  CNS A:  Baseline anxiety - Home meds list ativan 0.5mg  q8h prn. Patient unclear what he is on  P: - ativan scheduled 0.5mg  q12h - Scheduled po morphine 2.5 mg Q 12 for dyspnea   GLOBAL-carried forward: 02/08/14: Per RN: Lives with daughter.  Apparently patient wants to make step daughter HCPOA. But these 2 women do not get along. For now full code  02/09/14: HE is confused about his goals of care and wants help with decision making. Will call palliative care. From PCCM stand point, he is hospice eligible and DNI more appropriate.   02/10/14: Palliative care conducting goals of care. Family processing hard information. He has horrible barrell chest, class 4 dyspnea and severe hypoxemia. Hospice is ideal   02/11/14: Daughter [name] has several concerns that need to be addressed. She is finding it hard to come to terms with his prognosis and illness. Did not understand DNR completely. Has concerns dad over using benzo at home.   PLAN  - social work consult - palliative care consult for symptoms and goals    STAFF NOTE carried forward 6/7:  - he  has improved since admission but will not improve from here on. His current ECOG is 4. IT was 3-4 at home anyways; "12 feet radius" existence per daughter. His 6 month prognosis is very poor. Daughter has trouble accepting this  when code status or goals decision approached. He would be better off with better quality of life with home hospice even if he is full code.. If they wont accept hospice,  he will have to go home in this condition   CD Annamaria Boots, MD [facility] Pager: [phone], If no answer or between  15:00h - 7:00h: call [phone]  02/14/2014 9:37 AM

## 2014-02-15 DIAGNOSIS — Z66 Do not resuscitate: Secondary | ICD-10-CM | POA: Diagnosis not present

## 2014-02-15 MED ORDER — MORPHINE SULFATE (CONCENTRATE) 10 MG /0.5 ML PO SOLN: 5.0000 mg | Freq: Two times a day (BID) | ORAL | Status: AC

## 2014-02-15 MED ORDER — ENSURE COMPLETE PO LIQD: 237.0000 mL | Freq: Two times a day (BID) | ORAL | Status: AC

## 2014-02-15 MED ORDER — AZITHROMYCIN 250 MG PO TABS: ORAL_TABLET | ORAL | Status: AC

## 2014-02-15 MED ORDER — PANTOPRAZOLE SODIUM 40 MG PO TBEC: 40.0000 mg | DELAYED_RELEASE_TABLET | Freq: Two times a day (BID) | ORAL | Status: AC

## 2014-02-15 MED ORDER — IPRATROPIUM-ALBUTEROL 0.5-2.5 (3) MG/3ML IN SOLN: 3.0000 mL | Freq: Four times a day (QID) | RESPIRATORY_TRACT | Status: AC

## 2014-02-15 MED ORDER — CLONIDINE HCL 0.3 MG/24HR TD PTWK: 0.3000 mg | MEDICATED_PATCH | TRANSDERMAL | Status: AC

## 2014-02-15 MED ORDER — MORPHINE SULFATE (CONCENTRATE) 10 MG /0.5 ML PO SOLN: 5.0000 mg | ORAL | Status: AC | PRN

## 2014-02-15 MED ORDER — ALBUTEROL SULFATE (2.5 MG/3ML) 0.083% IN NEBU: 2.5000 mg | INHALATION_SOLUTION | RESPIRATORY_TRACT | Status: AC | PRN

## 2014-02-15 MED ORDER — SENNOSIDES-DOCUSATE SODIUM 8.6-50 MG PO TABS: 2.0000 | ORAL_TABLET | Freq: Two times a day (BID) | ORAL | Status: AC

## 2014-02-15 MED ORDER — PREDNISONE 20 MG PO TABS: 40.0000 mg | ORAL_TABLET | Freq: Every day | ORAL | Status: AC

## 2014-02-15 MED ORDER — MAGIC MOUTHWASH: 10.0000 mL | Freq: Three times a day (TID) | ORAL | Status: AC

## 2014-02-15 MED ORDER — LORAZEPAM 1 MG PO TABS: 1.0000 mg | ORAL_TABLET | Freq: Two times a day (BID) | ORAL | Status: AC

## 2014-02-15 NOTE — Discharge Summary (Signed)
Physician Discharge Summary  Patient ID: [name] MRN: [id number] DOB/AGE: 19-Jul-1941 73 y.o.  Admit date: 02/05/2014 Discharge date: 02/15/2014    Discharge Diagnoses:  Acute on Chronic Hypoxic Respiratory Failure Acute Exacerbation of COPD Dyspnea Hypertension Hyponatremia Severe Protein Calorie Malnutrition Anxiety Goals of Care                                                                         DISCHARGE PLAN BY DIAGNOSIS     Acute on Chronic Hypoxic Respiratory Failure Acute Exacerbation of COPD Dyspnea  Discharge Plan: -Duonebs Q6 + PRN albuterol -Azithromycin 250 mg QD for prophylaxis -PRN Roxanol & Ativan for air hunger -slow prednisone taper to 10mg  QD, likely will need maintenance dose of steroids given severity of COPD (taper stops at 10mg ) -BID PPI with steroids -continue home oxygen   Hypertension  Discharge Plan: -Clonidine patch 0.3mg  Q week, will need reassessment for need as patient progresses   Hyponatremia Severe Protein Calorie Malnutrition  Discharge Plan: -Continue Ensure -Diet as tolerated -continue colace with narcotic use   Anxiety Goals of Care  Discharge Plan: -DNR - patient has explicitly confirmed this with his daughter present-he told her under no circumstances should this be overturned or should she go against his wishes -Home hospice for discharge -Leave foley catheter in for patient comfort -Roxanol & Ativan for air hunger -arranged for hospital bed, oxygen, nebulizer, pain medications, and 3-in-1                  DISCHARGE SUMMARY   [name] is a 73 y.o. y/o male, former smoker, with a PMH of PMH of HTN, and COPD with FEV1 of 26% (2012) who presented to [facility] on 5/[facility] with 3 days history of progressive SOB. He is followed by Dr. Elsworth Soho and was on Advair/Spiriva prior to admission. On arrival to the ED, he was found to be in respiratory distress and hypoxemic as low as 83% on RA. Patient was  started on BiPAP with significant improvement.  He was treated with IV steroids, duonebs & levaquin.  Chest X ray showed severe hyperinflation but no parenchymal infiltrates.  ABG on BiPAP 5/5 and FiO2 40% showed 7.41 / 47 / 71 / 30 / 94. Respiratory viral panel assessed and was negative.  Patient was treated with 7 days of anti-biotics.  He made slow improvement with bipap and above Rx.  Given severity of patients lung disease, a trial of morphine was initiated for dyspnea with improvement in work of breathing / air hunger.  He was also evaluated by [place] and ultimately he chose to be a DNR.  There are ongoing family dynamics and his daughters were present for the discussion.  He desires under no circumstances should DNR be overturned.  See discharge plans as above.      CULTURES:  MRSA sreen 5/30 > neg  RVP 5/30 >>>neg  Sputum culture 5/30 > not obtained   ANTIBIOTICS:  Levaquin 5/30 >> 6/5  SIGNIFICANT EVENTS / STUDIES:  5/30 - Duragesic added @ 25 mcg per hour - stopped 02/09/14  5/31 - Much lower wob on bipap this am, a bit shaky and nursing reports beer up to  12 per day pta  6/02 - Scheduled Po morphine for increased respiratory distress and wob with desats and air hunger and agitation. Settled with morphine IV and ativan IV. During time of acute distress indicated to RNs that he would like to be comfortable and DNR and DNI and no bipap. When MD arrived he said he is not sure but did admit that his life at home is worse than in hospital and is miserable due to dyspnea. Also, indicated step daughter should be HCPOA (no formal paper work). He says he is uanble to decide on DNI but wants help with family to make this decision  6/03 - More awake after lowering morphine scheduled dose. Appears to be at right balance of wakefulness and dyspnea control. Palliative notes noted  6/04 - More sleepy. RN reports morphine being held intermittently. Daughter April has several concerns that  need to be addressed ongoing basis . Patient described to be living in 12 feet existence - ECOG 4 at baseline due to copd  6/5- Palliative care re-visit with full DNR and plan to d/c with home hospice.     Discharge Exam: General: frail adult male in NAD, DOE   Neuro: Alert, attentive MAE, AAOx4  CV: s1s2 rrr, no m/r/g, distant  PULM: resp's even/non-labored, prolonged exp phase, lungs bilaterally distant but clear  GI: flat, soft, bsx4 active  Extremities: warm, dry, no edema  Skin- no rash   Filed Vitals:   02/14/14 2359 02/15/14 0419 02/15/14 0519 02/15/14 0800  BP:   113/76 114/68  Pulse: 95 81 102 99  Temp:   97.4 F (36.3 C) 99.3 F (37.4 C)  TempSrc:   Oral Oral  Resp: $Remo'18 18 18 18  'TkGfE$ Height:      Weight:      SpO2: 98% 94% 93% 97%     Discharge Labs  BMET  Recent Labs Lab 02/09/14 0311 02/10/14 0215 02/11/14 0345 02/12/14 0634  NA 128* 131* 134* 134*  K 4.9 5.1 4.6 5.1  CL 87* 86* 92* 91*  CO2 33* 36* 35* 34*  GLUCOSE 92 119* 97 110*  BUN 23 27* 25* 27*  CREATININE 0.66 0.68 0.62 0.66  CALCIUM 9.3 9.3 9.4 9.4  MG 2.0 2.3 2.1 1.9  PHOS 2.9 3.4 3.0 4.1   CBC  Recent Labs Lab 02/10/14 0215 02/11/14 0345 02/12/14 0634  HGB 15.2 15.3 14.9  HCT 44.9 44.4 44.0  WBC 13.6* 13.0* 13.4*  PLT 144* 139* 131*      Medication List    STOP taking these medications       ADVAIR DISKUS 250-50 MCG/DOSE Aepb  Generic drug:  Fluticasone-Salmeterol     hydrochlorothiazide 12.5 MG capsule  Commonly known as:  MICROZIDE     levalbuterol 45 MCG/ACT inhaler  Commonly known as:  XOPENEX HFA     naproxen sodium 220 MG tablet  Commonly known as:  ANAPROX     verapamil 240 MG CR tablet  Commonly known as:  CALAN-SR      TAKE these medications       azithromycin 250 MG tablet  Commonly known as:  ZITHROMAX  1 tablet by mouth daily     cloNIDine 0.3 mg/24hr patch  Commonly known as:  CATAPRES - Dosed in mg/24 hr  Place 1 patch (0.3 mg total) onto the  skin once a week.     feeding supplement (ENSURE COMPLETE) Liqd  Take 237 mLs by mouth 2 (two) times daily between meals.  FLOMAX 0.4 MG Caps capsule  Generic drug:  tamsulosin  Take 0.4 mg by mouth daily.     ipratropium-albuterol 0.5-2.5 (3) MG/3ML Soln  Commonly known as:  DUONEB  Take 3 mLs by nebulization every 6 (six) hours.     LORazepam 1 MG tablet  Commonly known as:  ATIVAN  Take 1 tablet (1 mg total) by mouth 2 (two) times daily.     magic mouthwash Soln  Take 10 mLs by mouth 3 (three) times daily.     morphine CONCENTRATE 10 mg / 0.5 ml concentrated solution  Take 0.25 mLs (5 mg total) by mouth every 2 (two) hours as needed for moderate pain, severe pain, anxiety or shortness of breath (Air Hunger).     morphine CONCENTRATE 10 mg / 0.5 ml concentrated solution  Take 0.25 mLs (5 mg total) by mouth every 12 (twelve) hours.     multivitamin capsule  Take 1 capsule by mouth daily.     pantoprazole 40 MG tablet  Commonly known as:  PROTONIX  Take 1 tablet (40 mg total) by mouth 2 (two) times daily before a meal.     predniSONE 20 MG tablet  Commonly known as:  DELTASONE  Take 2 tablets (40 mg total) by mouth daily with breakfast. 2 tabs PO for 1 week, then 1.5 tabs by mouth for one week, then 1 tab by mouth for one week, then 1/2 tab daily     senna-docusate 8.6-50 MG per tablet  Commonly known as:  Senokot-S  Take 2 tablets by mouth 2 (two) times daily.     VENTOLIN HFA 108 (90 BASE) MCG/ACT inhaler  Generic drug:  albuterol  Inhale 2 puffs into the lungs every 4 (four) hours as needed.     albuterol (2.5 MG/3ML) 0.083% nebulizer solution  Commonly known as:  PROVENTIL  Take 3 mLs (2.5 mg total) by nebulization every 2 (two) hours as needed for wheezing or shortness of breath.          Disposition: Home with Hospice & Palliative Care  Discharged Condition: [name] has met maximum benefit of inpatient care and is medically stable and cleared for  discharge.  Patient is pending follow up as above.      Time spent on disposition:  Greater than 35 minutes.   Signed: Noe Gens, NP-C [facility] Pgr: [phone] Office: [phone]  Reviewed and agree with above Merton Border, MD ; [facility] service Mobile [phone].  After 5:30 PM or weekends, call [phone]

## 2014-02-15 NOTE — Progress Notes (Signed)
Patient discharge teaching given, including activity, diet, follow-up appoints, and medications. Patient verbalized understanding of all discharge instructions. IV access was d/c'd. Vitals are stable. Skin is intact except as charted in most recent assessments. Foley catheter to remain for comfort care. Report given to Hospice care RN. Pt to be transported to daughter's home by ambulance.

## 2014-02-15 NOTE — Progress Notes (Signed)
CSW (Clinical Education officer, museum) received call from RN CM notifying pt will need ambulance transport home and family is requesting 2pm transport. CSW called pt daughter April Tersigni and confirmed address listed on facesheet is correct. CSW also confirmed time. CSW arranged non emergent ambulance at 2pm per family request. Pt nurse informed. At this time, pt has no further social work needs.   [place], [place]

## 2014-02-15 NOTE — Progress Notes (Signed)
NCM spoke with daughter April, she chose [facility],  NCM faxed referral to [place] at [facility]. [phone], phone [phone].  Patient has home oxygen with [facility] and also a nebulizer machine.  Patient will need hospital bed and  3 n 1 and bedside table,  [facility] will be ordering this for patient.  Patient will need ambulance transport at dc and April would like ambulance to pick patient up at 2 pm, [place] CSW aware.    Patient will be going home with a foley in place as well, [facility] aware and April aware.  NCM informed floor RN of this information.

## 2014-03-22 ENCOUNTER — Telehealth: Payer: Self-pay

## 2014-03-22 NOTE — Telephone Encounter (Signed)
Patient died @ [facility] in [place] per [place]

## 2014-04-10 DEATH — deceased
